# Patient Record
Sex: Female | Born: 1970 | Race: Asian | Hispanic: Yes | Marital: Single | Smoking: Never smoker
Health system: Southern US, Community
[De-identification: ages and names within clinical notes are randomized; demographics above are authoritative.]

## PROBLEM LIST (undated history)

## (undated) DIAGNOSIS — G5600 Carpal tunnel syndrome, unspecified upper limb: Secondary | ICD-10-CM

## (undated) HISTORY — PX: CARPAL TUNNEL RELEASE: SHX101

## (undated) HISTORY — PX: ACHILLES TENDON REPAIR: SUR1153

## (undated) HISTORY — PX: EYE SURGERY: SHX253

## (undated) HISTORY — PX: FEMUR SURGERY: SHX943

## (undated) HISTORY — PX: HERNIA REPAIR: SHX51

---

## 2015-01-20 ENCOUNTER — Emergency Department (INDEPENDENT_AMBULATORY_CARE_PROVIDER_SITE_OTHER)
Admission: EM | Admit: 2015-01-20 | Discharge: 2015-01-20 | Disposition: A | Discharge status: Home / Self Care | Payer: Self-pay | Source: Home / Self Care | Attending: Family Medicine | Admitting: Family Medicine

## 2015-01-20 ENCOUNTER — Encounter (HOSPITAL_COMMUNITY): Payer: Self-pay | Admitting: Emergency Medicine

## 2015-01-20 DIAGNOSIS — K589 Irritable bowel syndrome without diarrhea: Secondary | ICD-10-CM

## 2015-01-20 DIAGNOSIS — E739 Lactose intolerance, unspecified: Secondary | ICD-10-CM

## 2015-01-20 MED ORDER — ONDANSETRON HCL 4 MG/2ML IJ SOLN
4.0000 mg | Freq: Once | INTRAMUSCULAR | Status: AC
  Administered 2015-01-20: 4 mg via INTRAMUSCULAR

## 2015-01-20 MED ORDER — HYOSCYAMINE SULFATE ER 0.375 MG PO TB12
0.3750 mg | ORAL_TABLET | Freq: Two times a day (BID) | ORAL | Status: DC
Start: 2015-01-20 — End: 2015-03-18

## 2015-01-20 MED ORDER — ONDANSETRON HCL 4 MG/2ML IJ SOLN
INTRAMUSCULAR | Status: AC
  Filled 2015-01-20: qty 2

## 2015-01-20 NOTE — ED Provider Notes (Signed)
CSN: [id number]     Arrival date & time 01/20/15  1659 History   First MD Initiated Contact with Patient 01/20/15 1815     Chief Complaint  Patient presents with  . Abdominal Pain   (Consider location/radiation/quality/duration/timing/severity/associated sxs/prior Treatment) Patient is a 44 y.o. female presenting with abdominal pain. The history is provided by the patient and the spouse. The history is limited by a language barrier. A language interpreter was used Forensic scientist(ramon and husband trans.).  Abdominal Pain Pain location:  RLQ Pain quality: cramping and stabbing   Pain radiates to:  Does not radiate Chronicity:  New Context: diet changes and suspicious food intake   Context comment:  Aggrav sx after dairy products. Relieved by:  None tried Worsened by:  Nothing tried Ineffective treatments:  None tried Associated symptoms: nausea   Associated symptoms: no fever, no hematemesis, no hematochezia, no hematuria and no vomiting   Associated symptoms comment:  Pain level 4/10 Risk factors comment:  Neg extensive w/u in [place] in May prior to coming to [place], except colonoscopy not done   History reviewed. No pertinent past medical history. Past Surgical History  Procedure Laterality Date  . Hernia repair     No family history on file. History  Substance Use Topics  . Smoking status: Never Smoker   . Smokeless tobacco: Not on file  . Alcohol Use: No   OB History    No data available     Review of Systems  Constitutional: Negative.  Negative for fever.  Gastrointestinal: Positive for nausea and abdominal pain. Negative for vomiting, blood in stool, hematochezia, abdominal distention, anal bleeding, rectal pain and hematemesis.  Genitourinary: Negative for hematuria.    Allergies  Review of patient's allergies indicates no known allergies.  Home Medications   Prior to Admission medications   Medication Sig Start Date End Date Taking? Authorizing Provider  diazepam (VALIUM) 5  MG tablet Take 5 mg by mouth every 6 (six) hours as needed for anxiety.   Yes Historical Provider, MD  hyoscyamine (LEVBID) 0.375 MG 12 hr tablet Take 1 tablet (0.375 mg total) by mouth 2 (two) times daily. 01/20/15   Linna HoffJames D Einar Nolasco, MD   BP 143/98 mmHg  Pulse 75  Temp(Src) 98.4 F (36.9 C) (Oral)  Resp 16  SpO2 99%  LMP 12/28/2014 Physical Exam  Constitutional: She is oriented to person, place, and time. She appears well-developed and well-nourished. No distress.  Abdominal: Soft. Normal appearance and bowel sounds are normal. She exhibits no mass. There is tenderness. There is no rigidity, no rebound and no guarding.    Neurological: She is alert and oriented to person, place, and time.  Skin: Skin is warm and dry.  Nursing note and vitals reviewed.   ED Course  Procedures (including critical care time) Labs Review Labs Reviewed - No data to display  Imaging Review No results found.   MDM   1. IBS (irritable bowel syndrome)   2. Lactose intolerance in adult        Linna HoffJames D Miyuki Rzasa, MD 01/20/15 [phone]

## 2015-01-20 NOTE — ED Notes (Signed)
2 days of abdominal pain.  Has had a similar episode before and tests run, but everything was fine-[place].  This episode, the pain is worse.  Last bm yesterday and normal.  No issues with urination.  Patient reports flank pain.  Describes pain as sharp, stabbing.

## 2015-01-20 NOTE — Discharge Instructions (Signed)
Diet and medicine as advised, see specialist if further problems.

## 2015-03-18 ENCOUNTER — Encounter (HOSPITAL_COMMUNITY): Payer: Self-pay | Admitting: Emergency Medicine

## 2015-03-18 ENCOUNTER — Emergency Department (HOSPITAL_COMMUNITY): Payer: Medicaid Other

## 2015-03-18 ENCOUNTER — Emergency Department (HOSPITAL_COMMUNITY)
Admission: EM | Admit: 2015-03-18 | Discharge: 2015-03-18 | Disposition: A | Discharge status: Home / Self Care | Payer: Medicaid Other | Attending: Emergency Medicine | Admitting: Emergency Medicine

## 2015-03-18 DIAGNOSIS — R197 Diarrhea, unspecified: Secondary | ICD-10-CM

## 2015-03-18 DIAGNOSIS — K580 Irritable bowel syndrome with diarrhea: Secondary | ICD-10-CM | POA: Insufficient documentation

## 2015-03-18 DIAGNOSIS — E739 Lactose intolerance, unspecified: Secondary | ICD-10-CM

## 2015-03-18 DIAGNOSIS — Z3202 Encounter for pregnancy test, result negative: Secondary | ICD-10-CM | POA: Insufficient documentation

## 2015-03-18 DIAGNOSIS — R1031 Right lower quadrant pain: Secondary | ICD-10-CM | POA: Diagnosis present

## 2015-03-18 DIAGNOSIS — G8929 Other chronic pain: Secondary | ICD-10-CM | POA: Insufficient documentation

## 2015-03-18 DIAGNOSIS — N830 Follicular cyst of ovary: Secondary | ICD-10-CM | POA: Insufficient documentation

## 2015-03-18 DIAGNOSIS — R112 Nausea with vomiting, unspecified: Secondary | ICD-10-CM

## 2015-03-18 DIAGNOSIS — K589 Irritable bowel syndrome without diarrhea: Secondary | ICD-10-CM

## 2015-03-18 DIAGNOSIS — N83202 Unspecified ovarian cyst, left side: Secondary | ICD-10-CM

## 2015-03-18 DIAGNOSIS — R109 Unspecified abdominal pain: Secondary | ICD-10-CM

## 2015-03-18 LAB — COMPREHENSIVE METABOLIC PANEL
ALBUMIN: 3.5 g/dL (ref 3.5–5.0)
ALT: 13 U/L — ABNORMAL LOW (ref 14–54)
AST: 16 U/L (ref 15–41)
Alkaline Phosphatase: 48 U/L (ref 38–126)
Anion gap: 8 (ref 5–15)
BILIRUBIN TOTAL: 0.5 mg/dL (ref 0.3–1.2)
BUN: 6 mg/dL (ref 6–20)
CALCIUM: 8.4 mg/dL — AB (ref 8.9–10.3)
CO2: 21 mmol/L — ABNORMAL LOW (ref 22–32)
CREATININE: 0.74 mg/dL (ref 0.44–1.00)
Chloride: 106 mmol/L (ref 101–111)
GFR calc Af Amer: >60 mL/min (ref >60)
GLUCOSE: 88 mg/dL (ref 65–99)
Potassium: 3.9 mmol/L (ref 3.5–5.1)
Sodium: 135 mmol/L (ref 135–145)
TOTAL PROTEIN: 6.3 g/dL — AB (ref 6.5–8.1)

## 2015-03-18 LAB — URINALYSIS, ROUTINE W REFLEX MICROSCOPIC
BILIRUBIN URINE: NEGATIVE
GLUCOSE, UA: NEGATIVE mg/dL
HGB URINE DIPSTICK: NEGATIVE
Ketones, ur: 15 mg/dL — AB
Leukocytes, UA: NEGATIVE
Nitrite: NEGATIVE
Protein, ur: NEGATIVE mg/dL
SPECIFIC GRAVITY, URINE: 1.005 (ref 1.005–1.030)
Urobilinogen, UA: 0.2 mg/dL (ref 0.0–1.0)
pH: 6 (ref 5.0–8.0)

## 2015-03-18 LAB — CBC WITH DIFFERENTIAL/PLATELET
BASOS ABS: 0 10*3/uL (ref 0.0–0.1)
BASOS PCT: 0 % (ref 0–1)
Eosinophils Absolute: 0.1 10*3/uL (ref 0.0–0.7)
Eosinophils Relative: 1 % (ref 0–5)
HEMATOCRIT: 38 % (ref 36.0–46.0)
HEMOGLOBIN: 12.6 g/dL (ref 12.0–15.0)
LYMPHS PCT: 23 % (ref 12–46)
Lymphs Abs: 1.8 10*3/uL (ref 0.7–4.0)
MCH: 27.2 pg (ref 26.0–34.0)
MCHC: 33.2 g/dL (ref 30.0–36.0)
MCV: 81.9 fL (ref 78.0–100.0)
Monocytes Absolute: 0.4 10*3/uL (ref 0.1–1.0)
Monocytes Relative: 5 % (ref 3–12)
NEUTROS PCT: 71 % (ref 43–77)
Neutro Abs: 5.6 10*3/uL (ref 1.7–7.7)
Platelets: 194 10*3/uL (ref 150–400)
RBC: 4.64 MIL/uL (ref 3.87–5.11)
RDW: 13.6 % (ref 11.5–15.5)
WBC: 7.9 10*3/uL (ref 4.0–10.5)

## 2015-03-18 LAB — LIPASE, BLOOD: Lipase: 33 U/L (ref 22–51)

## 2015-03-18 LAB — POC URINE PREG, ED: Preg Test, Ur: NEGATIVE

## 2015-03-18 MED ORDER — MORPHINE SULFATE (PF) 4 MG/ML IV SOLN
4.0000 mg | Freq: Once | INTRAVENOUS | Status: AC
  Administered 2015-03-18: 4 mg via INTRAVENOUS
  Filled 2015-03-18: qty 1

## 2015-03-18 MED ORDER — HYDROCODONE-ACETAMINOPHEN 5-325 MG PO TABS: 1.0000 | ORAL_TABLET | Freq: Four times a day (QID) | ORAL | Status: DC | PRN

## 2015-03-18 MED ORDER — NAPROXEN 500 MG PO TABS: 500.0000 mg | ORAL_TABLET | Freq: Two times a day (BID) | ORAL | Status: DC | PRN

## 2015-03-18 MED ORDER — IOHEXOL 300 MG/ML  SOLN
100.0000 mL | Freq: Once | INTRAMUSCULAR | Status: AC | PRN
  Administered 2015-03-18: 100 mL via INTRAVENOUS

## 2015-03-18 MED ORDER — IOHEXOL 300 MG/ML  SOLN
25.0000 mL | Freq: Once | INTRAMUSCULAR | Status: DC | PRN
  Administered 2015-03-18: 25 mL via ORAL
  Filled 2015-03-18: qty 30

## 2015-03-18 MED ORDER — DICYCLOMINE HCL 20 MG PO TABS: 20.0000 mg | ORAL_TABLET | Freq: Two times a day (BID) | ORAL | Status: DC | PRN

## 2015-03-18 MED ORDER — ONDANSETRON HCL 4 MG/2ML IJ SOLN
4.0000 mg | Freq: Once | INTRAMUSCULAR | Status: AC
  Administered 2015-03-18: 4 mg via INTRAVENOUS
  Filled 2015-03-18: qty 2

## 2015-03-18 MED ORDER — ONDANSETRON HCL 8 MG PO TABS: 8.0000 mg | ORAL_TABLET | Freq: Three times a day (TID) | ORAL | Status: DC | PRN

## 2015-03-18 MED ORDER — SODIUM CHLORIDE 0.9 % IV BOLUS (SEPSIS)
1000.0000 mL | Freq: Once | INTRAVENOUS | Status: AC
Start: 2015-03-18 — End: 2015-03-18
  Administered 2015-03-18: 1000 mL via INTRAVENOUS

## 2015-03-18 NOTE — ED Notes (Signed)
Pt here c/o RLQ pain with radiation to right flank x 10 months worse after eating foods with lactose

## 2015-03-18 NOTE — Discharge Instructions (Signed)
Tus simptomas pueden ser por una problema con lacteos, o puede ser intestinos irritables. Tambien puede ser por un cyste del ovario. [place] naprosyn y norco para dolor, pero no podes manejar cuando estas tomando norco. [place] bentyl para tus simptomas. [place] zofran para nausea. Llama al gastroenterologo para una cita en 1-2 semanas. Hace una cita con [facility] en 1 semana para tener un doctor primaria. Si tus simptomas [place] or empeoran, volve a la sala de Sports administrator.   Use zofran as prescribed, as needed for nausea. Stay well hydrated with small sips of fluids throughout the day. Follow a BRAT (banana-rice-applesauce-toast) diet as described below for the next 24-48 hours. The 'BRAT' diet is suggested, then progress to diet as tolerated as symptoms abate. Call your doctor if bloody stools, persistent diarrhea, vomiting, fever or abdominal pain. Return to ER for changing or worsening of symptoms.  Food Choices to Help Relieve Diarrhea When you have diarrhea, the foods you eat and your eating habits are very important. Choosing the right foods and drinks can help relieve diarrhea. Also, because diarrhea can last up to 7 days, you need to replace lost fluids and electrolytes (such as sodium, potassium, and chloride) in order to help prevent dehydration.  WHAT GENERAL GUIDELINES DO I NEED TO FOLLOW?  Slowly drink 1 cup (8 oz) of fluid for each episode of diarrhea. If you are getting enough fluid, your urine will be clear or pale yellow.  Eat starchy foods. Some good choices include white rice, white toast, pasta, low-fiber cereal, baked potatoes (without the skin), saltine crackers, and bagels.  Avoid large servings of any cooked vegetables.  Limit fruit to two servings per day. A serving is  cup or 1 small piece.  Choose foods with less than 2 g of fiber per serving.  Limit fats to less than 8 tsp (38 g) per day.  Avoid fried foods.  Eat foods that have probiotics in them. Probiotics  can be found in certain dairy products.  Avoid foods and beverages that may increase the speed at which food moves through the stomach and intestines (gastrointestinal tract). Things to avoid include:  High-fiber foods, such as dried fruit, raw fruits and vegetables, nuts, seeds, and whole grain foods.  Spicy foods and high-fat foods.  Foods and beverages sweetened with high-fructose corn syrup, honey, or sugar alcohols such as xylitol, sorbitol, and mannitol. WHAT FOODS ARE RECOMMENDED? Grains White rice. White, [place], or pita breads (fresh or toasted), including plain rolls, buns, or bagels. White pasta. Saltine, soda, or graham crackers. Pretzels. Low-fiber cereal. Cooked cereals made with water (such as cornmeal, farina, or cream cereals). Plain muffins. Matzo. Melba toast. Zwieback.  Vegetables Potatoes (without the skin). Strained tomato and vegetable juices. Most well-cooked and canned vegetables without seeds. Tender lettuce. Fruits Cooked or canned applesauce, apricots, cherries, fruit cocktail, grapefruit, peaches, pears, or plums. Fresh bananas, apples without skin, cherries, grapes, cantaloupe, grapefruit, peaches, oranges, or plums.  Meat and Other Protein Products Baked or boiled chicken. Eggs. Tofu. Fish. Seafood. Smooth peanut butter. Ground or well-cooked tender beef, ham, veal, lamb, pork, or poultry.  Dairy Plain yogurt, kefir, and unsweetened liquid yogurt. Lactose-free milk, buttermilk, or soy milk. Plain hard cheese. Beverages Sport drinks. Clear broths. Diluted fruit juices (except prune). Regular, caffeine-free sodas such as ginger ale. Water. Decaffeinated teas. Oral rehydration solutions. Sugar-free beverages not sweetened with sugar alcohols. Other Bouillon, broth, or soups made from recommended foods.  The items listed above may not be  a complete list of recommended foods or beverages. Contact your dietitian for more options. WHAT FOODS ARE NOT  RECOMMENDED? Grains Whole grain, whole wheat, bran, or rye breads, rolls, pastas, crackers, and cereals. Wild or brown rice. Cereals that contain more than 2 g of fiber per serving. Corn tortillas or taco shells. Cooked or dry oatmeal. Granola. Popcorn. Vegetables Raw vegetables. Cabbage, broccoli, Brussels sprouts, artichokes, baked beans, beet greens, corn, kale, legumes, peas, sweet potatoes, and yams. Potato skins. Cooked spinach and cabbage. Fruits Dried fruit, including raisins and dates. Raw fruits. Stewed or dried prunes. Fresh apples with skin, apricots, mangoes, pears, raspberries, and strawberries.  Meat and Other Protein Products Chunky peanut butter. Nuts and seeds. Beans and lentils. Tomasa Blase.  Dairy High-fat cheeses. Milk, chocolate milk, and beverages made with milk, such as milk shakes. Cream. Ice cream. Sweets and Desserts Sweet rolls, doughnuts, and sweet breads. Pancakes and waffles. Fats and Oils Butter. Cream sauces. Margarine. Salad oils. Plain salad dressings. Olives. Avocados.  Beverages Caffeinated beverages (such as coffee, tea, soda, or energy drinks). Alcoholic beverages. Fruit juices with pulp. Prune juice. Soft drinks sweetened with high-fructose corn syrup or sugar alcohols. Other Coconut. Hot sauce. Chili powder. Mayonnaise. Gravy. Cream-based or milk-based soups.  The items listed above may not be a complete list of foods and beverages to avoid. Contact your dietitian for more information. WHAT SHOULD I DO IF I BECOME DEHYDRATED? Diarrhea can sometimes lead to dehydration. Signs of dehydration include dark urine and dry mouth and skin. If you think you are dehydrated, you should rehydrate with an oral rehydration solution. These solutions can be purchased at pharmacies, retail stores, or online.  Drink -1 cup (120-240 mL) of oral rehydration solution each time you have an episode of diarrhea. If drinking this amount makes your diarrhea worse, try drinking smaller  amounts more often. For example, drink 1-3 tsp (5-15 mL) every 5-10 minutes.  A general rule for staying hydrated is to drink 1-2 L of fluid per day. Talk to your health care provider about the specific amount you should be drinking each day. Drink enough fluids to keep your urine clear or pale yellow. Document Released: 09/17/2003 Document Revised: 07/02/2013 Document Reviewed: 05/20/2013 [facility] Patient Information 2015 [place], [place]. This information is not intended to replace advice given to you by your health care provider. Make sure you discuss any questions you have with your health care provider.  Abdominal (belly) pain can be caused by many things. Your caregiver performed an examination and possibly ordered blood/urine tests and imaging (CT scan, x-rays, ultrasound). Many cases can be observed and treated at home after initial evaluation in the emergency department. Even though you are being discharged home, abdominal pain can be unpredictable. Therefore, you need a repeated exam if your pain does not resolve, returns, or worsens. Most patients with abdominal pain don't have to be admitted to the hospital or have surgery, but serious problems like appendicitis and gallbladder attacks can start out as nonspecific pain. Many abdominal conditions cannot be diagnosed in one visit, so follow-up evaluations are very important. SEEK IMMEDIATE MEDICAL ATTENTION IF YOU DEVELOP ANY OF THE FOLLOWING SYMPTOMS:  The pain does not go away or becomes severe.   A temperature above 101 develops.   Repeated vomiting occurs (multiple episodes).   The pain becomes localized to portions of the abdomen. The right side could possibly be appendicitis. In an adult, the left lower portion of the abdomen could be colitis or diverticulitis.  Blood is being passed in stools or vomit (bright red or black tarry stools).   Return also if you develop chest pain, difficulty breathing, dizziness or fainting, or become  confused, poorly responsive, or inconsolable (young children).  The constipation stays for more than 4 days.   There is belly (abdominal) or rectal pain.   You do not seem to be getting better.     Dolor abdominal en las mujeres (Abdominal Pain, Women) El dolor abdominal (en el estmago, la pelvis o el vientre) puede tener muchas causas. Es importante que le informe a su mdico:  La ubicacin del Engineer, mining.  Viene y se va, o persiste todo el tiempo?  Hay situaciones que Location manager (comer ciertos alimentos, la actividad fsica)?  Tiene otros sntomas asociados al dolor (fiebre, nuseas, vmitos, diarrea)? Todo es de gran ayuda cuando se trata de hallar la causa del dolor. CAUSAS  Estmago: Infecciones por virus o bacterias, o lcera.  Intestino: Apendicitis (apndice inflamado), ileitis regional (enfermedad de Crohn), colitis ulcerosa (colon inflamado), sndrome del colon irritable, diverticulitis (inflamacin de los divertculos del colon) o cncer de estmago oo intestino.  Enfermedades de la vescula biliar o clculos.  Enfermedades renales, clculos o infecciones en el rin.  Infeccin o cncer del pncreas.  Fibromialgia (trastorno doloroso)  Enfermedades de los rganos femeninos:  Uterus: tero: fibroma (tumor no canceroso) o infeccin  Trompas de [place]: infeccin o embarazo ectpico  En los ovarios, quistes o tumores.  Adherencias plvicas (tejido cicatrizal).  Endometriosis (el tejido que cubre el tero se desarrolla en la pelvis y los rganos plvicos).  Sndrome de Agricultural engineer (los rganos femeninos se llenan de sangre antes del periodo menstrual(  Dolor durante el periodo menstrual.  Dolor durante la ovulacin (al producir vulos).  Dolor al usar el DIU (dispositivo intrauterino para el control de la natalidad)  Psychologist, clinical los rganos femeninos.  Dolor funcional (no est originado en una enfermedad, puede mejorar sin  tratamiento).  Dolor de origen psicolgico  Depresin. DIAGNSTICO Su mdico decidir la gravedad del dolor a travs del examen fsico  Anlisis de sangre  Radiografas  Ecografas  TC (tomografa computada, tipo especial de radiografas).  IMR (resonancia magntica)  Cultivos, en el caso una infeccin  Colon por enema de bario (se inserta una sustancia de contraste en el intestino grueso para mejorar la observacin con rayos X.)  Colonoscopa (observacin del intestino con un tubo luminoso).  Laparoscopa (examen del interior del abdomen con un tubo que tiene Intel Corporation).  Ciruga exploratoria abdominal mayor (se observa el abdomen realizando una gran incisin). TRATAMIENTO El tratamiento depender de la causa del problema.   Muchos de estos casos pueden controlarse y tratarse en casa.  Medicamentos de venta libre indicados por el mdico.  Medicamentos con receta.  Antibiticos, en caso de infeccin  Pldoras anticonceptivas, en el caso de perodos dolorosos o dolor al ovular.  Tratamiento hormonal, para la endometriosis  Inyecciones para bloqueo nervioso selectivo.  Fisioterapia.  Antidepresivos.  Consejos por parte de un psclogo o psiquiatra.  Ciruga mayor o menor. INSTRUCCIONES PARA EL CUIDADO DOMICILIARIO  No tome ni administre laxantes a menos que se lo haya indicado su mdico.  Tome analgsicos de venta libre slo si se lo ha indicado el profesional que lo asiste. No tome aspirina, ya que puede causar Apple Computer o hemorragias.  Consuma una dieta lquida (caldo o agua) segn lo indicado por el mdico. Progrese lentamente a una dieta blanda, segn la [place],  si el dolor se relaciona con el estmago o el intestino.  Tenga un termmetro y tmese la temperatura varias veces al da.  Haga reposo en la cama y [place], si esto Research scientist (life sciences).  Evite las relaciones sexuales, Counsellor.  Evite las situaciones  estresantes.  Cumpla con las visitas y los anlisis de control, segn las indicaciones de su mdico.  Si el dolor no se [place] con los medicamentos o la [place], [place] tratar con:  Acupuntura.  Ejercicios de relajacin (yoga, meditacin).  Terapia grupal.  Psicoterapia. SOLICITE ATENCIN MDICA SI:  Nota que ciertos Pharmacist, community de [place].  El tratamiento indicado para Arboriculturist no Marketing executive.  Necesita analgsicos ms fuertes.  Quiere que le retiren el DIU.  Si se siente confundido o desfalleciente.  Presenta nuseas o vmitos.  Aparece una erupcin cutnea.  Sufre efectos adversos o una reaccin alrgica debido a los medicamentos que toma. SOLICITE ATENCIN MDICA DE INMEDIATO SI:  El dolor persiste o se agrava.  Tiene fiebre.  Siente el dolor slo en algunos sectores del abdomen. Si se localiza en la zona derecha, posiblemente podra tratarse de apendicitis. En un adulto, si se localiza en la regin inferior izquierda del abdomen, podra tratarse de colitis o diverticulitis.  Hay sangre en las heces (deposiciones de color rojo brillante o negro alquitranado), con o sin vmitos.  Usted presenta sangre en la orina.  Siente escalofros con o sin fiebre.  Se desmaya. ASEGRESE QUE:   Comprende estas instrucciones.  Controlar su enfermedad.  Solicitar ayuda de inmediato si no mejora o si empeora. Document Released: 10/13/2008 Document Revised: 09/19/2011 [facility] Patient Information 2015 [place], [place]. This information is not intended to replace advice given to you by your health care provider. Make sure you discuss any questions you have with your health care provider.  Diarrea  (Diarrhea) La diarrea consiste en evacuaciones intestinales frecuentes, blandas o acuosas. Puede hacerlo sentir dbil y deshidratado. La deshidratacin puede hacer que se sienta cansado, sediento, tener la boca seca y que haya disminucin de  [place], que a menudo es de color amarillo oscuro. La diarrea es un signo de otro problema, generalmente una infeccin que no durar [employer]. En la [employer], la diarrea dura tpicamente 2 a 3 das. Sin embargo, puede durar ms tiempo si se trata de un signo de algo ms serio. Es importante tratar la diarrea como lo indique su mdico para disminuir o prevenir futuros episodios de Research scientist (medical).  CAUSAS  Algunas causas comunes son:   Infecciones gastrointestinales causadas por virus, bacterias o parsitos.  Intoxicacin alimentaria o alergias a los alimentos.  Ciertos medicamentos, como los antibiticos, quimioterapia y laxantes.  Edulcorantes artificiales y fructosa.  Los trastornos [employer] Nextel Corporation. INSTRUCCIONES PARA EL CUIDADO EN EL HOGAR   Asegure una adecuada ingesta de lquidos (hidratacin). Evite los lquidos que contengan azcares simples o las bebidas deportivas, los jugos de frutas, los productos derivados de la leche entera y Shell. Si bebe la cantidad suficiente de lquidos, la orina debe ser clara o amarillo plido. Una solucin de rehidratacin oral se puede comprar en las farmacias, en las tiendas minoristas y por Internet. Se puede preparar una solucin de rehidratacin oral casera con los siguientes ingredientes:   - cucharadita de sal.   cucharadita de bicarbonato.   de cucharadita de sal sustituta que contenga cloruro de potasio.  1  cucharada de azcar.  1l (34 onzas) de agua.  Ciertos alimentos y bebidas pueden aumentar la velocidad a la que el alimento se mueve a travs del tracto gastrointestinal (GI). Estos alimentos y bebidas deben evitarse e incluyen:  Bebidas alcohlicas y con cafena.  Alimentos ricos en fibra, como frutas y verduras, nueces, semillas, panes y cereales integrales.  Alimentos y bebidas endulzados con alcoholes de azcar, tales como xilitol, sorbitol, y manitol.  Algunos alimentos pueden ser bien tolerados y puede ayudar a  [employer], incluyendo:  Alimentos con almidn, como arroz, pan, pasta, cereales bajos en azcar, avena, smola de maz, papas al horno, galletas y panecillos.  Bananas.  Pur de [employer].  Agregue alimentos ricos en probiticos a la dieta del nio para ayudar a aumentar las bacterias saludables en el tracto gastrointestinal, como el yogur y productos lcteos fermentados.  Lvese bien las manos despus de cada episodio de diarrea.  Tome slo medicamentos de venta libre o recetados, segn las indicaciones del mdico.  Dewey un bao caliente para ayudar a disminuir ardor o dolor por los episodios frecuentes de diarrea. SOLICITE ATENCIN MDICA DE INMEDIATO SI:   No puede retener los lquidos.  Tiene vmitos persistentes.  Maxie Better en la materia fecal, o las heces son negras y de aspecto alquitranado.  No hay emisin de [place] durante 6 a 8 horas o elimina una pequea cantidad de [place].  Tiene dolor abdominal que aumenta o se localiza.  Est muy mareado o se desvanece.  Sufre un dolor intenso de [place].  La diarrea empeora o no mejora.  Tiene fiebre o sntomas que persisten durante ms de 2 o 3 das.  Tiene fiebre y los sntomas empeoran de manera sbita. ASEGRESE DE QUE:   Comprende estas instrucciones.  Controlar su enfermedad.  Solicitar ayuda de inmediato si no mejora o si empeora. Document Released: 06/27/2005 Document Revised: 06/13/2012 [facility] Patient Information 2015 [place], [place]. This information is not intended to replace advice given to you by your health care provider. Make sure you discuss any questions you have with your health care provider.  Opciones de alimentos para ayudar a Actuary (Food Choices to Help Relieve Diarrhea) Cuando se tiene diarrea, los alimentos que se ingieren y los hbitos de alimentacin son Engineer, production. Elegir los Altria Group y las bebidas adecuados ayuda a Actuary. Adems, debido a  que la diarrea puede durar [employer]vin[employer], debe reponer la prdida de lquidos y Customer service manager (como sodio, potasio y Editor, commissioning) a fin de ayudar a Statistician.  QU PAUTAS GENERALES DEBO SEGUIR?  Beba lentamente 1 taza (8 onzas) de lquido por cada episodio de diarrea. Si bebe una cantidad de lquidos suficiente, la orina ser de tono claro o color amarillo plido.  Consuma alimentos con almidn. Algunas buenas opciones son arroz blanco, tostada blanca, pasta, cereales con bajo contenido de fibras, papas al horno (sin cscara), galletas saladas y panecillos.  Evite las porciones grandes de cualquier vegetal cocido.  Limite las frutas a dos porciones por da. Una porcin es  taza o un trozo pequeo.  Alimentos con menos de 2 g de fibra por porcin.  Limite las grasas a menos de 8 cucharaditas (38g) por Futures trader.  Evite las comidas fritas.  Consuma alimentos que contengan probiticos. Los probiticos se encuentran en ciertos productos lcteos.  Evite los alimentos y las bebidas que pueden aumentar la velocidad a la que el alimento se mueve a travs del estmago y de los intestinos (tracto gastrointestinal). Lo que debe evitar:  Alimentos  ricos en fibra, como frutas secas, frutas y vegetales crudos, frutos secos, semillas, alimentos con cereales integrales.  Alimentos muy condimentados y con alto contenido de Neurosurgeon.  Alimentos y bebidas endulzados con jarabe de maz de alto contenido de fructosa, miel o alcoholes de International aid/development worker, como xilitol, sorbitol y manitol. QU ALIMENTOS SE RECOMIENDAN? Cereales Arroz blanco. Pan blanco, francs o pita (fresco o tostado), incluidos los [place], los bollos y las rosquillas. Pastas blancas. Galletas de [place], [place] o [place]. Pretzels. Cereales con bajo contenido de Sara Lee cocidos en agua (como harina de maz, smola o crema de cereales). Muffins. Pan cimo Tostada Melba. [place].  Vegetales Papas (sin cscara). Jugo de tomates o de  vegetales Vegetales bien cocidos o enlatados sin semillas. Deatra James tierna. Frutas Pur de Fisher Scientific cocido o enlatado, damascos, cerezas, cctel de frutas, pomelos, duraznos, peras o ciruelas. Bananas frescas, manzanas sin cscara, cerezas, uvas, meln, pomelo, duraznos, naranjas o ciruelas.  Carnes y otros productos con protenas Pollo al horno o hervido. Huevos. Tofu. Pescado. Mariscos. Mantequilla de man, sin trozos. Carne molida o un bife tierno bien cocido, jamn, ternera, cordero, cerdo o aves.  Lcteos Yogur natural, kefir y Dentist bebible sin Paediatric nurse. Leche sin Advice worker, suero de [place] o [place] de soja. Queso duro comn. Bebidas Bebidas deportivas. Caldos claros. Jugos de fruta diluidos (excepto de ciruelas). Gaseosas sin cafena comunes, como gaseosa de [place]. Agua. Ts descafeinados. Soluciones de rehidratacin oral. Bebidas sin azcar no endulzadas con alcoholes de azcar. Otros Consom, caldo o sopas hechas con los alimentos recomendados.  Los artculos mencionados arriba pueden no ser Raytheon de las bebidas o los alimentos recomendados. Comunquese con el nutricionista para conocer ms opciones. QU ALIMENTOS NO SE RECOMIENDAN? Cereales Cereales, galletas, pastas, panecillos y panes de cereales integrales, salvado o centeno. Arroz integral o arroz salvaje. Cereales con menos de 2 g de fibra por porcin. Tortillas de maz o tacos. Harina de avena cocida o seca. Granola. Palomitas de maz. Vegetales Vegetales crudos. Repollo, brcoli, repollitos de [place], alcachofas, porotos, hojas de remolacha, maz, col rizada, legumbres, guisantes y batatas. Cscara de papas. [place] y repollo cocidos. Nils Pyle Frutas secas, incluidas las ciruelas y los dtiles. Frutas crudas. Compota o ciruelas secas. Manzanas frescas con cscara, damascos, mangos, peras, frambuesas y frutillas.  Carnes y otros productos con protenas Mantequilla de man espesa. Frutos secos y semillas. Porotos y  lentejas. Panceta.  Lcteos Quesos con alto contenido de [place]. Leche, leche chocolatada y bebidas hechas con [place], como los batidos. Crema. Helados. Dulces y [employer], donas y pan dulce. Panqueques y waffles. Grasas y Barnes & Noble. Salsas a base de crema. Margarina. Aceites para ensaladas. Condimentos para ensaladas. Aceitunas. [place].  Bebidas Bebidas con cafena (como caf, t, refrescos o bebidas energizantes). Bebidas alcohlicas. Jugos de frutas con pulpa. Jugo de ciruelas. Bebidas endulzadas con jarabe de maz de alto contenido de fructosa o alcoholes de International aid/development worker. Otros Coco. Salsa picante. [place] en polvo. Mayonesa. Salsas. Sopas a base de crema o de [place].  Los artculos mencionados arriba pueden no ser Raytheon de las bebidas y los alimentos que se Theatre stage manager. Comunquese con el nutricionista para recibir ms informacin. QU DEBO HACER SI ME DESHIDRATO? Algunas veces, la diarrea puede producir deshidratacin. Entre los signos de deshidratacin se incluyen la orina oscura y la boca y la piel secas. Si piensa que est deshidratado, debe rehidratarse con una solucin de rehidratacin oral. Estas soluciones se pueden comprar en las farmacias, en las tiendas minoristas o por  Internet.  Beba  o 1 taza (120-219ml) de solucin de rehidratacin oral cada vez que tenga un episodio de diarrea. Si beber esta cantidad empeora la diarrea, intente beber en cantidades ms pequeas con ms frecuencia. Por ejemplo, tomar 1-3 cucharaditas (5-50ml) cada 5-57minutos.  Una regla general para mantenerse hidratado es beber 1  -2 litros de lquido Air cabin crew. Hable con el mdico sobre la cantidad especfica que usted debe beber diariamente. Beba suficiente lquido para Photographer orina clara o de color amarillo plido. Document Released: 06/27/2005 Document Revised: 07/02/2013 [facility] Patient Information 2015 [place], [place]. This information is not intended to replace  advice given to you by your health care provider. Make sure you discuss any questions you have with your health care provider.  Nuseas y Vmitos (Nausea and Vomiting) La nusea es la sensacin de Dentist en el estmago o de la necesidad de vomitar. El vmito es un reflejo por el que los contenidos del estmago salen por la boca. El vmito puede ocasionar prdida de lquidos del organismo (deshidratacin). Los nios y los [employer]OK pueden deshidratarse rpidamente (en especial si tambin tienen diarrea). Las nuseas y los vmitos son sntoma de un trastorno o enfermedad. Es importante Emergency planning/management officer causa de los sntomas. CAUSAS  Irritacin directa de la membrana que cubre el [place]. Esta irritacin puede ser resultado del aumento de la produccin de cido, (reflujo gastroesofgico), infecciones, intoxicacin alimentaria, ciertos medicamentos (como antinflamatorios no esteroideos), consumo de alcohol o de tabaco.  Seales del cerebro.Estas seales pueden ser un dolor de cabeza, exposicin al calor, trastornos del odo interno, aumento de la presin en el cerebro por lesiones, infeccin, un tumor o conmocin cerebral, estmulos emocionales o problemas metablicos.  Una obstruccin en el tracto gastrointestinal (obstruccin intestinal).  Ciertas enfermedades como la diabetes, problemas en la vescula biliar, apendicitis, problemas renales, cncer, sepsis, sntomas atpicos de infarto o trastornos alimentarios.  Tratamientos mdicos como la quimioterapia y la radiacin.  Medicamentos que inducen al sueo (anestesia general) durante [place]. DIAGNSTICO  El mdico podr solicitarle algunos anlisis si los problemas no mejoran luego de [address]. Tambin podrn pedirle anlisis si los sntomas son graves o si el motivo de los vmitos o las nuseas no est claro. Los [employer] ser:   Anlisis de [place].  Anlisis de [place].  Pruebas de materia fecal.  Cultivos (para buscar  evidencias de infeccin).  Radiografas u otros estudios por imgenes. Los [employer] Southern de las pruebas lo ayudarn al mdico a tomar decisiones acerca del mejor curso de tratamiento o la necesidad de Conseco.  TRATAMIENTO  Debe estar bien hidratado. Beba con frecuencia pequeas cantidades de lquido.Puede beber agua, bebidas deportivas, caldos claros o comer pequeos trocitos de hielo o gelatina para mantenerse hidratado.Cuando coma, hgalo lentamente para evitar las nuseas.Hay medicamentos para evitar las nuseas que pueden aliviarlo.  INSTRUCCIONES PARA EL CUIDADO DOMICILIARIO  Si su mdico le prescribe medicamentos tmelos como se le haya indicado.  Si no tiene hambre, no se fuerce a comer. Sin embargo, es necesario que tome lquidos.  Si tiene hambre alimntese con una dieta normal, a menos que el mdico le indique otra cosa.  Los mejores alimentos son Neomia Dear combinacin de carbohidratos complejos (arroz, trigo, papas, pan), carnes magras, yogur, frutas y Sports administrator.  Evite los alimentos ricos en grasas porque dificultan la digestin.  Beba gran cantidad de lquido para mantener la orina de tono claro o color amarillo plido.  Si est deshidratado, consulte a su mdico para que le d  instrucciones especficas para volver a hidratarlo. Los signos de deshidratacin son:  Franz Dell sed.  Labios y boca secos.  Mareos.  Larose Kells.  Disminucin de la frecuencia y cantidad de la [place].  Confusin.  Tiene el pulso o la respiracin acelerados. SOLICITE ATENCIN MDICA DE INMEDIATO SI:  Vomita sangre o algo similar a la borra del caf.  La materia fecal (heces) es negra o tiene [place].  Sufre una cefalea grave o rigidez en el cuello.  Se siente confundido.  Siente dolor abdominal intenso.  Tiene dolor en el pecho o dificultad para respirar.  No orina por 8 horas.  Tiene la piel fra y pegajosa.  Sigue vomitando durante ms de 24 a 48 horas.  Tiene  fiebre. ASEGRESE QUE:   Comprende estas instrucciones.  Controlar su enfermedad.  Solicitar ayuda inmediatamente si no mejora o si empeora. Document Released: 07/17/2007 Document Revised: 09/19/2011 [facility] Patient Information 2015 [place], [place]. This information is not intended to replace advice given to you by your health care provider. Make sure you discuss any questions you have with your health care provider.  Intolerancia a la lactosa, Adulto (Lactose Intolerance, Adult) Hay intolerancia a la lactosa cuando el organismo no puede digerir la [place], un azcar que se halla en la [place] y en los productos lcteos. La causa es que el organismo no produce la cantidad suficiente de una enzima llamada lactasa. Cuando no hay suficiente lactasa para digerir la cantidad de lactosa que se ha consumido, Producer, television/film/video. Intolerancia a la lactosa no significa alergia a los productos lcteos.  En la [employer] Resources, la enfermedad se desarrolla con el [place]. Luego de los 2 aos de vida, se empieza a Research officer, trade union. Pero muchas personas no experimentan sntomas [employer] Energy.  CAUSAS Factores que pueden provocar intolerancia a Water quality scientist.   El envejecimiento.  Nacer sin la capacidad de Scientist, forensic.  .Ciertas enfermedades del sistema digestivo.  Lesiones en el intestino delgado. SNTOMAS  Ganas de vomitar (nuseas).  Diarrea.  Clicos.  Hinchazn.  Gases. Los sntomas aparecen [employer] Scientific hora y dos horas despus de comer o beber productos que [place] manager.  TRATAMIENTO Ningn tratamiento puede hacer que el organismo produzca [place]. Sin embargo, los sntomas pueden controlarse siguiendo una dieta. Le indicarn que tome un medicamento cuando consuma alimentos o bebidas que [place]. El medicamento contiene la enzima lactasa, la que ayuda al organismo a Therapist, nutritional.  INSTRUCCIONES PARA EL CUIDADO EN EL HOGAR   Consuma  productos lcteos segn las indicaciones del mdico o del nutricionista.  Apple Computer medicamentos segn le indic su mdico.  Encuentre productos sin lactosa o con contenido reducido en las tiendas de su localidad.  Hable con su mdico o nutricionista si necesita suplementos dietarios. A continuacin se indica la cantidad de calcio que necesita recibir de la dieta:   19 a 50 aos 1000 mg  Ms de 50 aos 1200 mg Calcio y Advice worker en [place] Comunes Productos que no son lcteos / Contenido de Calcio (mg)  Jugo de naranjas fortificado con calcio, 1 taza / 308-344 mg  Sardinas, con espina comestibles, 3 oz / 270 mg  Salmn, enlatado con espinas comestibles, 3 oz / 205 mg  Leche de soja, fortificada, 1 taza / 200 mg  Brcoli (crudo), 1 taza / 90 mg  Naranja, 1 mediana / 50 mg  Alubias pintas,  taza / 40 mg  Atn, enlatado, 3 oz / 10 mg  Lechuga  verde,  taza / 10 mg Productos lcteos / Contenido de Calcio (mg) / Shelton Silvas Contenido (g)  Yogurt, simple, bajo en grasas, 1 taza / 415 mg / 5 g  Leche descremada, 1 taza / 295 mg / 11 g  Queso Swiss, 1 oz / 270 mg / 1 g  Helado  taza / 85 mg / 6 g  Queso requesn,  taza / 75 mg / 2-3 g SOLICITE ATENCIN MDICA SI:  No obtiene alivio de los sntomas.  Document Released: 09/23/2008 Document Revised: 09/19/2011 [facility] Patient Information 2015 [place], [place]. This information is not intended to replace advice given to you by your health care provider. Make sure you discuss any questions you have with your health care provider.  Dieta libre de lactosa (Lactose-Free Diet) La lactosa es un carbohidrato que se encuentra principalmente en la [place] y los productos lcteos, como tambin en alimentos con Waterproof y suero agregados. Para que la lactosa pueda ser [place] por el cuerpo, debe ser digerida por una enzima. La intolerancia a la lactosa ocurre cuando hay una escasez de [place]. Cuando su cuerpo no puede digerir la lactosa,  puede sentir nuseas, hinchazn, calambres, gases y [place]. TIPOS DE DEFICIENCIA DE LACTASA  Deficiencia de lactasa primaria. ste es el tipo ms comn. Se caracteriza por una reduccin lenta de la actividad de la lactasa.  Deficiencia de Altamease Oiler. Esto ocurre luego de una lesin en la mucosa del intestino delgado como consecuencia de una enfermedad. Tambin puede producirse luego de [place] o de un tratamiento con antibiticos o medicamentos para Animator. La tolerancia a la lactosa vara ampliamente, y cada persona debe determinar cunta cantidad de [place] puede consumir para no desarrollar sntomas. Beber pequeas porciones de [employer]ck & [employer] puede ser de [place]. Algunos estudios sugieren que retardar el vaciamiento gstrico puede ayudar a aumentar la tolerancia a productos lcteos. Esto puede realizarse mediante:  El consumo de [place] o productos lcteos acompaado de otros alimentos, Teacher, English as a foreign language de consumirlos solos.  Consumir leche con un mayor contenido graso. Existen muchos productos lcteos que International aid/development worker pueden tolerar mejor que la leche:  El queso (especialmente queso aejo) - el contenido de lactosa es mucho menor que en la [place].  El consumo de productos lcteos cultivados, como yogur, suero de [place], requesn, y [place] de [address] (kfir) normalmente es bien tolerado por individuos con deficiencia de Building control surveyor. Esto ocurre porque las bacterias ayudan a Therapist, nutritional.  La leche con lactosa hidrolizada contiene un 40-90% menos de lactosa que la [place] y tambin puede ser [place]. REQUERIMIENTOS Estas dietas pueden ser deficientes en calcio, riboflavina, y vitamina D, segn los Recommended Dietary Allowances (cantidades recomendadas en la dieta) del Exxon Mobil Corporation ([employer] de [place]). Es posible que se puedan PepsiCo recomendados, esto depende de la [place] individual y el consumo de sustitutos de [place],  [place], u otros productos lcteos. NOTAS ESPECIALES  La lactosa es un carbohidrato. La principal fuente de alimento son los productos lcteos. Es Secondary school teacher los rtulos de los alimentos. Muchos productos contienen lactosa aunque no hayan sido hechos a partir de [employer]. Busque las siguientes palabras: Suero, slidos lcteos, slidos lcteos deshidratados, polvo de [place] sin grasa. Entre las fuentes comunes de lactosa adems de los productos lcteos se incluyen panes, caramelos, embutidos, alimentos preparados y procesados, y salsas y Financial controller.  Todos los alimentos deben prepararse sin [place], crema u otros productos lcteos.  Puede ser necesario un suplemento de  vitaminas o minerales. Consulte con su mdico o nutricionista registrado.  La lactosa tambin se encuentra en muchos medicamentos de prescripcin o de venta libre.  Puede consumir leche de soja y suplementos libres de lactosa como alternativa a la [place]. ELECCIN DE LOS ALIMENTOS Panes/Fculas  Permitidos:  Panes y bollos hechos sin leche. Pan francs, de [place], [place]. Galletas de soda, galletas graham. Cualquier galleta preparada sin lactosa. Cereales cocidos o deshidratados preparados sin lactosa (vea el rtulo). Patatas, pastas, o arroz, preparados sin leche o [place]. Palomitas de maz.  Evite:  Panes y bollos hechos que ConocoPhillips. Mezclas preparadas como pasteles, bizcochos, buuelos, panqueques. [place] dulces, donas, tostada francesa (si contiene [place]). Bizcochos tostados, o cualquiera galletas que Centex Corporation. Cereales cocidos o deshidratados preparados con lactosa (vea el rtulo). Pur de papas instantneo, papas fritas congeladas, papas festoneadas o gratinadas. Vegatales  Permitidos:  Programmer, systems, congelados o enlatados.  Evite:  Vegetales con crema o rebozados. Vegetales en salsa de queso o con margarinas que contengan lactosa. Frutas  Permitidos:  Nils Pyle frescas,  enlatadas o congeladas que no estn procesadas con lactosa.  Evite:  Frutas enlatadas o congeladas que hayan sido procesadas con lactosa. Carnes y sustitutos  Permitidos:  Bife, pollo, pescado, pavo, cordero, ternero, cerdo o jamn. Productos preparados con carne. Alimentos crnicos preparados para bebs que no contengan leche. Huevos, soja, frutos secos.  Evite:  Huevos revueltos, omelettes y souffles que ConocoPhillips. Scrambled eggs, omelets, and souffles that contain milk. Carne, pescado o aves de corral con crema o empanadas. Salchichas de viena, leverwurst o fiambres que contengan slidos de [place]. Queso, queso cottage o queso untable. Leche  Permitidos:  Careers information officer.  Evite:  Leche (entera, al 2%, descremada o chocolatada). Evaporada, en polvo o condensada. Leche malteada. Sopas y alimentos combinados  Permitidos:  Sopa, caldo, sopa de verduras, consoms. Sopas preparadas en casa con los alimentos permitidos. Alimentos combinados o preparados que no contengan leche ni productos lcteos (lea las etiquetas).  Evite:  Sopas crema, en latas. Sopas comerciales que contengan lactosa Macaroni con queso, pizza. Alimentos combinados o preparados que contengan leche o productos lcteos. Postres y dulces  Permitidos:  Helados de agua y de fruta, gelatina, torta ngel. Galletitas, tortas, pasteles caseros preparados con los ingredientes permitidos. Budn (preparado con agua o sustituto de [place]). Postres de tofu sin lactosa. Azcar, miel, jarabe de maz, mermelada, gelatina, dulces, melaza (azcar de caa). Caramelos de azcar, marshmallows.  Evite:  Helados de crema, sorbetes, flan, budn, yougur helado. Mezclas comerciales para preparar tortas y galletitas. Postres que contengan chocolate. Masa para pastel que Clinical research associate, postres reducidos en caloras preparados con sustitutos del azcar que contangan lactosa. Caramelos toffee, de menta, caramelos duros, chocolate. Grases y  aceites  Permitidos:  Occupational hygienist, (segn la tolerancia; contiene muy pequea cantidad de [place]). Margarinas y [employer] Inc no contengan [place], aceites [place], [place], [place], crema artificial y coberturas sin lactosa ni slidos de Physiological scientist. [place].  Evite:  Margarinas y aderezos para [address] que contengan [place]; [place], [place] de man con slidos de [place] agregados, crema agria, bocaditos preparados con crema agria. Bebidas  Permitidos:  Bebidas carbonatadas, t, caf, y caf soluble Carbonated drinks, tea, caf y caf soluble, algunos cafs instantneos (verifique las etiquetas). Bebidas frutales, jugos de frutas y de [address], [place] de arroz o de soja.  Evite: Chocolate caliente. Algunos cacaos, algunos cafs instantneos, ts instantneos, jugos en polvo (lea las etiquetas). Condimentos  Permitidos:  Salsa de soja, polvo de  algarroba, aceitunas, salsa preparada con agua, cacao, condimentos y [address] [place], glutamato monosdico, [place], [place].  Evite:  Algunas gomas de mascar, chocolate, algunos cacaos. Ciertos antibiticos y preparados de vitaminas y minerales. Condimentos que contengan productos lcteos. Endulzantes artificieles que contengan lactosa. Algunas cremas no lcteas (lea las etiquetas). EJEMPLO DE MEN Desayuno   Jugo de naranjas  [place]  Bran flakes  Desnatador no lcteo  Pan de [place] (brind)  [place] o Psychiatrist y Mining engineer que no Therapist, sports  T o caf [place]  Doctor, hospital de pollo  Arroz  Habichuelas  [place] o Psychiatrist y aderezos que no contengan leche  Meln fresco  T o caf [place]  Ase carne de vaca  Papa asada  [place] o margarinas y aderezos que no contengan leche  Brcol  Ensalada de Company secretary con el vinagre y el petrleo que visten  Bizcocho de alimento de [place]  T [place] Document Released: 06/27/2005 Document Revised: 09/19/2011 [facility] Patient Information 2015 [place], [place]. This information is not  intended to replace advice given to you by your health care provider. Make sure you discuss any questions you have with your health care provider.  Quiste ovrico (Ovarian Cyst) Un quiste ovrico es un saco lleno de lquido o [place]. Este saco est unido al ovario. Algunos quistes desaparecen solos. Otros quistes necesitan tratamiento.  CUIDADOS EN EL HOGAR   Solo tome los medicamentos que le haya indicado su mdico.  Concurra a las consultas de control con el mdico, segn las indicaciones.  Hgase exmenes plvicos regulares y pruebas de Papanicolaou. SOLICITE AYUDA SI:  Sus perodos se atrasan o son irregulares o dolorosos.  Sus perodos cesan.  Siente dolor en el vientre (abdominal) o en la pelvis que no desaparece.  El abdomen se agranda o se inflama hincha.  Tiene dificultades para orinar (vaciar por completo la vejiga).  Siente presin en la vejiga.  Siente dolor durante las [employer].  Siente distensin, presin o Environmental manager.  Pierde peso sin ningn motivo.  Se siente mal constantemente.  Tiene dificultades para defecar (estreimiento).  No tiene ganas de comer.  Le aparecen granos (acn).  Nota un aumento del vello corporal y facial.  Domenic Moras de peso sin motivo.  Sospecha que est embarazada. SOLICITE AYUDA DE INMEDIATO SI:   El dolor en el vientre empeora.  Tiene Programme researcher, broadcasting/film/video (nuseas) y vomita.  Le sube la fiebre rpidamente.  Le duele el estmago mientras defeca.  Los perodos menstruales son ms abundantes de lo habitual. ASEGRESE DE QUE:   Comprende estas instrucciones.  Controlar su afeccin.  Recibir ayuda de inmediato si no mejora o si empeora. Document Released: 04/17/2013 [facility] Patient Information 2015 [place], [place]. This information is not intended to replace advice given to you by your health care provider. Make sure you discuss any questions you have with your health care provider.

## 2015-03-18 NOTE — ED Notes (Signed)
Pt transporting to CT at this time. NAD.

## 2015-03-18 NOTE — ED Provider Notes (Signed)
CSN: [id number]     Arrival date & time 03/18/15  1012 History   First MD Initiated Contact with Patient 03/18/15 1014     Chief Complaint  Patient presents with  . Abdominal Pain     (Consider location/radiation/quality/duration/timing/severity/associated sxs/prior Treatment) HPI Comments: Toni Hall is a 44 y.o. female with a PMHx of IBS and lactose intolerance, who presents to the ED with complaints of recurrent right lower quadrant pain. She reports that she has had 10 months of similar pain, has been worked up in [place] with no final diagnosis given. She reports that last night she developed this pain again, describing it as 7/10 constant waxing and waning sharp right lower quadrant pain radiating to the right flank, worse when she eats lactose foods, and unrelieved with Levbid, ibuprofen, and Tylenol. Associated symptoms include chills, nausea, 4 episodes of nonbloody nonbilious emesis, and 2 episodes of loose nonbloody diarrhea. Denies fevers, CP, SOB, constipation, obstipation, melena, hematochezia, hematemesis, rectal pain, hematuria, dysuria, vaginal bleeding or discharge, numbness, tingling, or weakness. She admits to chronic NSAID use, but denies any recent travel, suspicious food intake, sick contacts, or alcohol use. She denies being sexually active. Her only prior abdominal surgery was an umbilical hernia repair when she was pregnant several years ago.  Patient is a 44 y.o. female presenting with abdominal pain. The history is provided by the patient. A language interpreter was used (provider).  Abdominal Pain Pain location:  RLQ Pain quality: sharp   Pain radiates to:  R flank Pain severity:  Moderate Onset quality:  Gradual Duration:  1 day (10 months, but worse since yesterday) Timing:  Constant Progression:  Waxing and waning Chronicity:  Recurrent Context: not recent travel, not sick contacts and not suspicious food intake   Relieved by:  Nothing Worsened by:  Eating  (lactose foods) Ineffective treatments:  NSAIDs and acetaminophen (levbid, tylenol, motrin) Associated symptoms: diarrhea, nausea and vomiting   Associated symptoms: no chest pain, no chills, no constipation, no dysuria, no fever, no flatus, no hematemesis, no hematochezia, no hematuria, no melena, no shortness of breath, no vaginal bleeding and no vaginal discharge   Risk factors: NSAID use   Risk factors: no alcohol abuse     No past medical history on file. Past Surgical History  Procedure Laterality Date  . Hernia repair     No family history on file. Social History  Substance Use Topics  . Smoking status: Never Smoker   . Smokeless tobacco: Not on file  . Alcohol Use: No   OB History    No data available     Review of Systems  Constitutional: Negative for fever and chills.  Respiratory: Negative for shortness of breath.   Cardiovascular: Negative for chest pain.  Gastrointestinal: Positive for nausea, vomiting, abdominal pain and diarrhea. Negative for constipation, blood in stool, melena, hematochezia, rectal pain, flatus and hematemesis.  Genitourinary: Positive for flank pain. Negative for dysuria, hematuria, vaginal bleeding and vaginal discharge.  Musculoskeletal: Negative for myalgias and arthralgias.  Skin: Negative for color change.  Allergic/Immunologic: Negative for immunocompromised state.  Neurological: Negative for weakness and numbness.  Psychiatric/Behavioral: Negative for confusion.   10 Systems reviewed and are negative for acute change except as noted in the HPI.    Allergies  Review of patient's allergies indicates no known allergies.  Home Medications   Prior to Admission medications   Medication Sig Start Date End Date Taking? Authorizing Provider  diazepam (VALIUM) 5 MG tablet Take 5 mg  by mouth every 6 (six) hours as needed for anxiety.    Historical Provider, MD  hyoscyamine (LEVBID) 0.375 MG 12 hr tablet Take 1 tablet (0.375 mg total) by  mouth 2 (two) times daily. 01/20/15   Linna Hoff, MD   BP 113/67 mmHg  Pulse 68  Temp(Src) 98.3 F (36.8 C) (Oral)  Resp 18  SpO2 100% Physical Exam  Constitutional: She is oriented to person, place, and time. Vital signs are normal. She appears well-developed and well-nourished.  Non-toxic appearance. She appears distressed.  Afebrile, nontoxic, appears uncomfortable, tearful  HENT:  Head: Normocephalic and atraumatic.  Mouth/Throat: Oropharynx is clear and moist and mucous membranes are normal.  Eyes: Conjunctivae and EOM are normal. Right eye exhibits no discharge. Left eye exhibits no discharge.  Neck: Normal range of motion. Neck supple.  Cardiovascular: Normal rate, regular rhythm, normal heart sounds and intact distal pulses.  Exam reveals no gallop and no friction rub.   No murmur heard. Pulmonary/Chest: Effort normal and breath sounds normal. No respiratory distress. She has no decreased breath sounds. She has no wheezes. She has no rhonchi. She has no rales.  Abdominal: Soft. Normal appearance and bowel sounds are normal. She exhibits no distension. There is tenderness in the right lower quadrant. There is no rigidity, no rebound, no guarding, no CVA tenderness, no tenderness at McBurney's point and negative Murphy's sign.    Soft, nondistended, +BS throughout, with exquisite RLQ TTP, no r/g/r, neg murphy's, neg mcburney's, no CVA TTP, neg psoas sign, neg foot tap test  Musculoskeletal: Normal range of motion.  Neurological: She is alert and oriented to person, place, and time. She has normal strength. No sensory deficit.  Skin: Skin is warm, dry and intact. No rash noted.  Psychiatric: She has a normal mood and affect.  Nursing note and vitals reviewed.   ED Course  Procedures (including critical care time) Labs Review Labs Reviewed  COMPREHENSIVE METABOLIC PANEL - Abnormal; Notable for the following:    CO2 21 (*)    Calcium 8.4 (*)    Total Protein 6.3 (*)    ALT  13 (*)    All other components within normal limits  URINALYSIS, ROUTINE W REFLEX MICROSCOPIC (NOT AT [facility]) - Abnormal; Notable for the following:    Ketones, ur 15 (*)    All other components within normal limits  CBC WITH DIFFERENTIAL/PLATELET  LIPASE, BLOOD  POC URINE PREG, ED    Imaging Review Ct Abdomen Pelvis W Contrast  03/18/2015   CLINICAL DATA:  Right lower quadrant pain with radiation to right flank for several months  EXAM: CT ABDOMEN AND PELVIS WITH CONTRAST  TECHNIQUE: Multidetector CT imaging of the abdomen and pelvis was performed using the standard protocol following bolus administration of intravenous contrast. Oral contrast was also administered.  CONTRAST:  OMNIPAQUE IOHEXOL 300 MG/ML  SOLN  COMPARISON:  None.  FINDINGS: Lung bases are clear.  There is decreased attenuation in the liver, a finding consistent with a degree of hepatic steatosis. No focal liver lesions are identified. Gallbladder wall is not appreciably thickened. There is no biliary duct dilatation.  Spleen, pancreas, and adrenals appear normal. Kidneys bilaterally show no mass or hydronephrosis on either side. There is no renal or ureteral calculus on either side.  In the pelvis, the urinary bladder is midline with normal wall thickness. There is evidence of a collapsing left ovarian cyst measuring 2.4 x 2.0 cm. There is no other pelvic mass. A small  amount of free fluid is noted in the cul-de-sac. There is no demonstrable diverticulitis. Sigmoid colon wall is not thickened, and there is no surrounding mesenteric thickening.  Appendix appears within normal limits.  There is no bowel obstruction.  No free air or portal venous air.  There is no adenopathy or abscess in the abdomen or pelvis. The aorta appears unremarkable. There are no blastic or lytic bone lesions.  IMPRESSION: Evidence of hepatic steatosis.  No bowel obstruction. No bowel wall or mesenteric thickening. No abscess. Appendix appears normal.  No  renal or ureteral calculus.  No hydronephrosis.  Small collapsing left ovarian cyst with small amount of free fluid in cul-de-sac. Suspect recent ovarian cyst leakage/rupture.   Electronically Signed   By: Bretta Bang III M.D.   On: 03/18/2015 13:12   I have personally reviewed and evaluated these images and lab results as part of my medical decision-making.   EKG Interpretation None      MDM   Final diagnoses:  Chronic abdominal pain  IBS (irritable bowel syndrome)  Lactose intolerance  Nausea vomiting and diarrhea  RLQ abdominal pain  Cyst of left ovary    44 y.o. female here with chronic abd pain, has been worked up extensively in [place] but no records of this. Pain is RLQ radiating to R flank, states she's never had nephrolithiasis on all her analysis. Reports this morning pain returned again, associated with n/v/d. Worsens with lactose. On exam, exquisite RLQ TTP, nonperitoneal. Likely IBS/lactose intolerance, but will r/o appendicitis or diverticulitis, etc with CT abd/pelvis. Will check labs/Urine. Will give pain meds and nausea meds. Will reassess shortly.   1:52 PM Pain and nausea resolved. U/A clear. Upreg neg. CBC w/diff unremarkable. CMP WNL. Lipase clear. CT showing no acute findings aside from L ovarian cyst, likely from ovulation. Pt now recalling that she's been told this before, and her symptoms seem to always occur around the time she would be ovulating. Tolerating PO here. Will d/c home with pain meds, nausea meds, and bentyl. Discussed f/up with GI in 1wk, and [facility] in 1wk to establish care. I explained the diagnosis and have given explicit precautions to return to the ER including for any other new or worsening symptoms. The patient understands and accepts the medical plan as it's been dictated and I have answered their questions. Discharge instructions concerning home care and prescriptions have been given. The patient is STABLE and is discharged to home in good  condition.  BP 115/67 mmHg  Pulse 58  Temp(Src) 98.3 F (36.8 C) (Oral)  Resp 18  SpO2 100%  LMP   Meds ordered this encounter  Medications  . morphine 4 MG/ML injection 4 mg    Sig:   . ondansetron (ZOFRAN) injection 4 mg    Sig:   . sodium chloride 0.9 % bolus 1,000 mL    Sig:   . iohexol (OMNIPAQUE) 300 MG/ML solution 25 mL    Sig:   . iohexol (OMNIPAQUE) 300 MG/ML solution 100 mL    Sig:   . naproxen (NAPROSYN) 500 MG tablet    Sig: Take 1 tablet (500 mg total) by mouth 2 (two) times daily as needed for mild pain, moderate pain or headache (TAKE WITH MEALS.).    Dispense:  20 tablet    Refill:  0    Order Specific Question:  Supervising Provider    Answer:  MILLER, BRIAN [[id number]]  . HYDROcodone-acetaminophen (NORCO) 5-325 MG per tablet    Sig: Take  1 tablet by mouth every 6 (six) hours as needed for severe pain.    Dispense:  10 tablet    Refill:  0    Order Specific Question:  Supervising Provider    Answer:  Hyacinth Meeker, BRIAN [[id number]]  . dicyclomine (BENTYL) 20 MG tablet    Sig: Take 1 tablet (20 mg total) by mouth 2 (two) times daily as needed for spasms.    Dispense:  20 tablet    Refill:  0    Order Specific Question:  Supervising Provider    Answer:  MILLER, BRIAN [[id number]]  . ondansetron (ZOFRAN) 8 MG tablet    Sig: Take 1 tablet (8 mg total) by mouth every 8 (eight) hours as needed for nausea or vomiting.    Dispense:  10 tablet    Refill:  0    Order Specific Question:  Supervising Provider    Answer:  Eusebio Friendly     Darcel Frane Camprubi-Soms, PA-C 03/18/15 1356  Azalia Bilis, MD 03/18/15 1744

## 2015-04-23 ENCOUNTER — Ambulatory Visit: Payer: Self-pay | Admitting: Family Medicine

## 2015-06-24 ENCOUNTER — Encounter (HOSPITAL_COMMUNITY): Payer: Self-pay | Admitting: Emergency Medicine

## 2015-06-24 ENCOUNTER — Emergency Department (HOSPITAL_COMMUNITY)
Admission: EM | Admit: 2015-06-24 | Discharge: 2015-06-24 | Disposition: A | Discharge status: Home / Self Care | Payer: Medicaid Other | Attending: Emergency Medicine | Admitting: Emergency Medicine

## 2015-06-24 DIAGNOSIS — G5601 Carpal tunnel syndrome, right upper limb: Secondary | ICD-10-CM | POA: Diagnosis not present

## 2015-06-24 DIAGNOSIS — M79641 Pain in right hand: Secondary | ICD-10-CM | POA: Diagnosis present

## 2015-06-24 MED ORDER — KETOROLAC TROMETHAMINE 60 MG/2ML IM SOLN
60.0000 mg | Freq: Once | INTRAMUSCULAR | Status: AC
  Administered 2015-06-24: 60 mg via INTRAMUSCULAR
  Filled 2015-06-24: qty 2

## 2015-06-24 MED ORDER — IBUPROFEN 800 MG PO TABS: 800.0000 mg | ORAL_TABLET | Freq: Three times a day (TID) | ORAL | Status: DC

## 2015-06-24 NOTE — ED Notes (Signed)
Per translator line: pt in for eval of left finger pain x2 weeks, states pain radiates to palm of the hand from middle and ring finger. Pt denies any trauma, nad noted. Pulses present.

## 2015-06-24 NOTE — Discharge Instructions (Signed)
Síndrome del túnel carpiano °(Carpal Tunnel Syndrome) °El síndrome del túnel carpiano es una afección que causa dolor en la mano y en el brazo. El túnel carpiano es un espacio estrecho ubicado en el lado palmar de la muñeca. Los movimientos de la muñeca o ciertas enfermedades pueden causar hinchazón del túnel. Esta hinchazón comprime el nervio principal de la muñeca (nervio mediano). °CAUSAS  °Esta afección puede ser causada por lo siguiente:  °· Movimientos repetidos de la muñeca. °· Lesiones en la muñeca. °· Artritis. °· Un quiste o un tumor en el túnel carpiano. °· Acumulación de líquido durante el embarazo. °A veces, se desconoce la causa de esta afección.  °FACTORES DE RIESGO °Es más probable que esta afección se manifieste en:  °· Las personas que tienen trabajos en los que deben realizar los mismos movimientos repetidos de las muñecas, como los carniceros y los cajeros. °· Las mujeres. °· Las personas que tienen determinadas enfermedades, por ejemplo: °¨ Diabetes. °¨ Obesidad. °¨ Tiroides hipoactiva (hipotiroidismo). °¨ Insuficiencia renal. °SÍNTOMAS  °Los síntomas de esta afección incluyen lo siguiente:  °· Sensación de hormigueo en los dedos de la mano, especialmente el pulgar, el índice y el dedo medio. °· Hormigueo o adormecimiento en la mano. °· Sensación de dolor en todo el brazo, especialmente cuando la muñeca y el codo están flexionados durante mucho tiempo. °· Dolor en la muñeca que sube por el brazo hasta el hombro. °· Dolor que baja por la mano o los dedos. °· Sensación de debilidad en las manos. Tal vez tenga dificultad para tomar y sostener objetos. °Los síntomas pueden empeorar durante la noche.  °DIAGNÓSTICO  °Esta afección se diagnostica mediante la historia clínica y un examen físico. También pueden hacerle exámenes, que incluyen los siguientes:  °· Electromiografía (EMG). Esta prueba mide las señales eléctricas que los nervios les envían a los músculos. °· Radiografías. °TRATAMIENTO  °El  tratamiento de esta afección incluye lo siguiente: °· Cambios en el estilo de vida. Es importante dejar de hacer o modificar la actividad que causó la afección. °· Fisioterapia o terapia ocupacional. °· Analgésicos y antiinflamatorios. Esto puede incluir medicamentos que se inyectan en la muñeca. °· Una férula para la muñeca. °· Cirugía. °INSTRUCCIONES PARA EL CUIDADO EN EL HOGAR  °Si tiene una férula: °· Úsela como se lo haya indicado el médico. Quítesela solamente como se lo haya indicado el médico. °· Afloje la férula si los dedos se le entumecen, siente hormigueos o se le enfrían y se tornan de color azul. °· Mantenga la férula limpia y seca. °Instrucciones generales °· Tome los medicamentos de venta libre y los recetados solamente como se lo haya indicado el médico. °· Haga reposar la muñeca de toda actividad que le cause dolor. Si la afección tiene relación con el trabajo, hable con su empleador sobre los cambios que pueden hacerse, por ejemplo, usar una almohadilla para apoyar la muñeca mientras tipea. °· Si se lo indican, aplique hielo sobre la zona dolorida: °¨ Ponga el hielo en una bolsa plástica. °¨ Coloque una toalla entre la piel y la bolsa de hielo. °¨ Coloque el hielo durante 20 minutos, 2 a 3 veces por día. °· Concurra a todas las visitas de control como se lo haya indicado el médico. Esto es importante. °· Haga los ejercicios como se lo hayan indicado el médico, el fisioterapeuta o el terapeuta ocupacional. °SOLICITE ATENCIÓN MÉDICA SI:  °· Aparecen nuevos síntomas. °· El dolor no se alivia con los medicamentos. °· Los síntomas empeoran. °  °  Esta información no tiene como fin reemplazar el consejo del médico. Asegúrese de hacerle al médico cualquier pregunta que tenga. °  °Document Released: 06/27/2005 Document Revised: 03/18/2015 °Elsevier Interactive Patient Education ©2016 [employer] ° °

## 2015-06-24 NOTE — ED Provider Notes (Signed)
CSN: [id number]     Arrival date & time 06/24/15  2051 History  By signing my name below, I, Toni Hall, attest that this documentation has been prepared under the direction and in the presence of Lavonte Palos, PA-C. Electronically Signed: Evon Hall, ED Scribe. 06/25/2015. 1:08 AM.      Chief Complaint  Patient presents with  . Hand Pain    Patient is a 44 y.o. female presenting with hand pain. The history is provided by the patient. A language interpreter was used.  Hand Pain   HPI Comments: Toni Hall is a 44 y.o. female who presents to the Emergency Department complaining of stabbing-cramping right hand pain onset 3 weeks prior. Pt states that the pain is mostly located in her right 3rd and 4th digit and intermittently radiates to her palm. She describes the pain as "lightening strikes". 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 She states that the pain is worse in the middle of night and occasionally wakes her from sleep. Pt states that she has tried naproxen and ibuprofen with slight relief. Denies exacerbating factors. Pt denies injury or trauma to the hand. Denies loss of sensation or weakness of the hand. She has no other complaints today.    History reviewed. No pertinent past medical history. Past Surgical History  Procedure Laterality Date  . Hernia repair     No family history on file. Social History  Substance Use Topics  . Smoking status: Never Smoker   . Smokeless tobacco: None  . Alcohol Use: No   OB History    No data available     Review of Systems  Musculoskeletal: Positive for arthralgias.  All other systems reviewed and are negative.     Allergies  Review of patient's allergies indicates no known allergies.  Home Medications   Prior to Admission medications   Medication Sig Start Date End Date Taking? Authorizing Provider  dicyclomine (BENTYL) 20 MG tablet Take 1 tablet  (20 mg total) by mouth 2 (two) times daily as needed for spasms. 03/18/15   Mercedes Camprubi-Soms, PA-C  HYDROcodone-acetaminophen (NORCO) 5-325 MG per tablet Take 1 tablet by mouth every 6 (six) hours as needed for severe pain. 03/18/15   Mercedes Camprubi-Soms, PA-C  ibuprofen (ADVIL,MOTRIN) 800 MG tablet Take 1 tablet (800 mg total) by mouth 3 (three) times daily. 06/24/15   Kelseigh Diver, PA-C  naproxen (NAPROSYN) 500 MG tablet Take 1 tablet (500 mg total) by mouth 2 (two) times daily as needed for mild pain, moderate pain or headache (TAKE WITH MEALS.). 03/18/15   Mercedes Camprubi-Soms, PA-C  ondansetron (ZOFRAN) 8 MG tablet Take 1 tablet (8 mg total) by mouth every 8 (eight) hours as needed for nausea or vomiting. 03/18/15   Mercedes Camprubi-Soms, PA-C   BP 130/80 mmHg  Pulse 60  Temp(Src) 97.3 F (36.3 C) (Oral)  Resp 16  Wt 57.352 kg  SpO2 98%   Physical Exam  Constitutional: She is oriented to person, place, and time. She appears well-developed and well-nourished. No distress.  HENT:  Head: Normocephalic and atraumatic.  Eyes: Conjunctivae and EOM are normal. Right eye exhibits no discharge. Left eye exhibits no discharge. No scleral icterus.  Neck: Normal range of motion. Neck supple. No tracheal deviation present.  Cardiovascular: Normal rate, regular rhythm and intact distal pulses.   Radial pulses palpable. Cap refill less than 2 seconds.  Pulmonary/Chest: Effort normal. No respiratory distress.  Musculoskeletal:  Normal range of motion.       Left hand: She exhibits normal range of motion, no tenderness, normal capillary refill, no deformity and no swelling. Normal sensation noted. Normal strength noted.  Right hand and digits are non-tender to palpation. Retains full range of motion of the digits and wrist. No obvious edema or deformity of the hand. Tinel's positive for reproducible symptoms in the second and third digits of the hand.  Neurological: She is alert and oriented to  person, place, and time. Coordination normal.  5/5 grip strength bilaterally. Sensation to light touch intact throughout.  Skin: Skin is warm and dry.  No erythema or other skin changes noted to the left hand.  Psychiatric: She has a normal mood and affect. Her behavior is normal.  Nursing note and vitals reviewed.   ED Course  Procedures (including critical care time) DIAGNOSTIC STUDIES: Oxygen Saturation is 100% on RA, normal by my interpretation.    COORDINATION OF CARE: 10:06 PM-Discussed treatment plan with pt at bedside and pt agreed to plan.     Labs Review Labs Reviewed - No data to display  Imaging Review No results found.    EKG Interpretation None      MDM   Final diagnoses:  Carpal tunnel syndrome of right wrist   44 year old female presenting with 2 week history of intermittent shooting pains in the palm and third and fourth digits of the right hand. Vital signs stable. Right hand is neurovascularly intact. No skin changes noted over the hand. Tinel's test positive for reproducible shooting pains in the second and third digits of the left hand. Pain managed in emergency department with Toradol. Symptoms seem consistent with carpal tunnel syndrome though the thumb is not involved. We'll give patient a wrist brace and NSAIDs. Given referral for orthopedics if symptoms do not improve. Return precautions given in discharge paperwork and discussed with pt at bedside. Pt stable for discharge  I personally performed the services described in this documentation, which was scribed in my presence. The recorded information has been reviewed and is accurate.      [name], PA-C 06/25/15 0109  [name], MD 06/25/15 [phone]

## 2015-06-24 NOTE — ED Notes (Signed)
See pa note.  

## 2015-07-20 ENCOUNTER — Encounter (HOSPITAL_COMMUNITY): Payer: Self-pay | Admitting: Emergency Medicine

## 2015-07-20 ENCOUNTER — Emergency Department (HOSPITAL_COMMUNITY)
Admission: EM | Admit: 2015-07-20 | Discharge: 2015-07-20 | Disposition: A | Discharge status: Home / Self Care | Payer: Medicaid Other | Attending: Emergency Medicine | Admitting: Emergency Medicine

## 2015-07-20 DIAGNOSIS — S39012A Strain of muscle, fascia and tendon of lower back, initial encounter: Secondary | ICD-10-CM | POA: Diagnosis not present

## 2015-07-20 DIAGNOSIS — M25511 Pain in right shoulder: Secondary | ICD-10-CM

## 2015-07-20 DIAGNOSIS — S4991XA Unspecified injury of right shoulder and upper arm, initial encounter: Secondary | ICD-10-CM | POA: Diagnosis not present

## 2015-07-20 DIAGNOSIS — Y9389 Activity, other specified: Secondary | ICD-10-CM | POA: Diagnosis not present

## 2015-07-20 DIAGNOSIS — Z791 Long term (current) use of non-steroidal anti-inflammatories (NSAID): Secondary | ICD-10-CM | POA: Insufficient documentation

## 2015-07-20 DIAGNOSIS — S199XXA Unspecified injury of neck, initial encounter: Secondary | ICD-10-CM | POA: Diagnosis not present

## 2015-07-20 DIAGNOSIS — Y998 Other external cause status: Secondary | ICD-10-CM | POA: Insufficient documentation

## 2015-07-20 DIAGNOSIS — S3992XA Unspecified injury of lower back, initial encounter: Secondary | ICD-10-CM | POA: Diagnosis present

## 2015-07-20 DIAGNOSIS — Y92410 Unspecified street and highway as the place of occurrence of the external cause: Secondary | ICD-10-CM | POA: Insufficient documentation

## 2015-07-20 MED ORDER — IBUPROFEN 800 MG PO TABS: 800.0000 mg | ORAL_TABLET | Freq: Three times a day (TID) | ORAL | Status: DC

## 2015-07-20 MED ORDER — CYCLOBENZAPRINE HCL 10 MG PO TABS: 10.0000 mg | ORAL_TABLET | Freq: Two times a day (BID) | ORAL | Status: DC | PRN
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
## 2015-07-29 ENCOUNTER — Emergency Department (HOSPITAL_COMMUNITY)
Admission: EM | Admit: 2015-07-29 | Discharge: 2015-07-29 | Disposition: A | Discharge status: Home / Self Care | Payer: Medicaid Other | Attending: Emergency Medicine | Admitting: Emergency Medicine

## 2015-07-29 ENCOUNTER — Encounter (HOSPITAL_COMMUNITY): Payer: Self-pay | Admitting: Emergency Medicine

## 2015-07-29 DIAGNOSIS — M545 Low back pain: Secondary | ICD-10-CM | POA: Diagnosis present

## 2015-07-29 DIAGNOSIS — Z791 Long term (current) use of non-steroidal anti-inflammatories (NSAID): Secondary | ICD-10-CM | POA: Insufficient documentation

## 2015-07-29 DIAGNOSIS — Z87828 Personal history of other (healed) physical injury and trauma: Secondary | ICD-10-CM | POA: Diagnosis not present

## 2015-07-29 DIAGNOSIS — M62830 Muscle spasm of back: Secondary | ICD-10-CM

## 2015-07-29 MED ORDER — ACETAMINOPHEN 500 MG PO TABS: 500.0000 mg | ORAL_TABLET | Freq: Four times a day (QID) | ORAL | Status: DC | PRN

## 2015-07-29 MED ORDER — ACETAMINOPHEN 325 MG PO TABS
650.0000 mg | ORAL_TABLET | Freq: Once | ORAL | Status: AC
  Administered 2015-07-29: 650 mg via ORAL
  Filled 2015-07-29: qty 2

## 2015-07-29 MED ORDER — IBUPROFEN 800 MG PO TABS: 800.0000 mg | ORAL_TABLET | Freq: Three times a day (TID) | ORAL | Status: DC

## 2015-07-29 MED ORDER — METHOCARBAMOL 500 MG PO TABS: 500.0000 mg | ORAL_TABLET | Freq: Two times a day (BID) | ORAL | Status: DC
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
## 2015-07-29 NOTE — Discharge Instructions (Signed)
Alternate tylenol every 6 hours and ibuprofen every 3 hours for pain control. Continue taking the muscle relaxer and using your heating pad. Schedule a follow up appointment with PCP if symptoms do not improve.    Ejercicios para la espalda (Back Exercises) Los siguientes ejercicios fortalecen los msculos que dan soporte a la espalda y, [place], ayudan a Pharmacologist la flexibilidad de la zona lumbar. Hacer estos ejercicios puede ser de ayuda para Psychologist, sport and exercise de espalda o Engineer, materials actual. Si tiene dolor o molestias en la espalda, intente hacer estos ejercicios 2 o 3veces por da, o como se lo haya indicado el mdico. Cuando el dolor desaparezca, hgalos una vez por da, pero haga ms repeticiones de cada ejercicio. Si no tiene dolor o [employer], haga estos ejercicios una vez por da o como se lo haya indicado el mdico. EJERCICIOS Rodilla al pecho Repita estos pasos 3 o 5veces con cada pierna: 1. Acustese boca arriba sobre una cama dura o sobre el suelo con las piernas extendidas. 2. Lleve una rodilla al pecho. La otra pierna debe quedar extendida y en contacto con el suelo. 3. Mantenga la rodilla contra el pecho. Para lograrlo tmese la rodilla o el muslo. 4. Tire de la rodilla hasta sentir una elongacin suave en la parte baja de la espalda. 5. Mantenga la elongacin durante 10 a 30segundos. 6. Suelte y extienda la pierna lentamente. Inclinacin de la pelvis Repita estos pasos 5 o 10veces: 1. Acustese boca arriba sobre una cama dura o sobre el suelo con las piernas extendidas. 2. Flexione las rodillas de modo que apunten al techo y los pies queden apoyados en el suelo. 3. Contraiga los msculos de la parte baja del abdomen para empujar la zona lumbar contra el suelo. Con este movimiento se inclinar la pelvis de modo que el cccix apunte hacia el techo, en lugar de apuntar en direccin a los pies o al suelo. 4. Contraiga suavemente y respire con normalidad mientras  mantiene esta posicin durante 5 a 10segundos. El perro y el gato Repita estos pasos hasta que la zona lumbar se vuelva ms flexible: 1. Apoye las palmas de las manos y las rodillas sobre una superficie firme. Las manos deben estar alineadas con los hombros y las rodillas con las caderas. Puede colocarse almohadillas debajo de las rodillas para estar cmodo. 2. Deje caer la cabeza y baje el cccix en direccin al suelo de modo que la zona lumbar se arquee como el lomo de un gato [place]. 3. Mantenga esta posicin durante 5segundos. 4. Lentamente, levante la cabeza y eleve el cccix de modo que apunte en direccin al techo para que la espalda forme un arco hundido como el lomo de un perro contento. 5. Mantenga esta posicin durante 5segundos. Flexiones de brazos Repita estos pasos 5 o 10veces: 1. Acustese boca abajo en el suelo. 2. Coloque las palmas de las manos cerca de la cabeza, separadas aproximadamente al ancho de los hombros. 3. Con la espalda lo ms relajada posible y las caderas apoyadas en el suelo, extienda lentamente los brazos para levantar la mitad superior del cuerpo y Optometrist los hombros. No use los msculos de la espalda para elevar la parte superior del torso. Puede cambiar las manos de lugar para estar ms cmodo. 4. Mantenga esta posicin durante 5segundos mientras mantiene la espalda relajada. 5. Lentamente vuelva a la posicin horizontal. Puentes Repita estos pasos 10veces: 1. Acustese boca arriba sobre una superficie firme. 2. Flexione las rodillas  de modo que apunten al techo y los pies queden apoyados en el suelo. 3. Contraiga los glteos y despegue las nalgas del suelo hasta que la cintura est casi a la misma altura que las rodillas. Debe sentir el trabajo muscular en las nalgas y la parte posterior de los muslos. Si no siente el esfuerzo de [employer], aleje los pies 1 o 2pulgadas (2,5 o 5centmetros) de las nalgas. 4. Mantenga esta posicin durante 3 a  5segundos. 5. Baje lentamente las caderas a la posicin inicial y relaje los glteos por completo. Si este ejercicio le resulta muy fcil, intente realizarlo con los brazos cruzados [place]. Abdominales Repita estos pasos 5 o 10veces: 1. Acustese boca arriba sobre una cama dura o sobre el suelo con las piernas extendidas. 2. Flexione las rodillas de modo que apunten al techo y los pies queden apoyados en el suelo. 3. Cruce los [employer]. 4. Baje levemente el mentn en direccin al pecho sin doblar el cuello. 5. Contraiga los msculos del abdomen y con lentitud eleve el torso lo suficiente como para Artist los omplatos del suelo. No eleve el torso ms alto que eso, porque esto puede sobreexigir a la zona lumbar y no ayuda a Investment banker, operational. 6. Regrese lentamente a la posicin inicial. Elevaciones de espalda Repita estos pasos 5 o 10veces: 1. Acustese boca abajo con los brazos a los costados del cuerpo y apoye la frente en el suelo. 2. Contraiga los msculos de las piernas y los glteos. 3. Lentamente despegue el pecho del suelo [employer] las caderas bien apoyadas en el suelo. Mantenga la nuca alineada con la curvatura de la espalda. Los ojos deben mirar al suelo. 4. Mantenga esta posicin durante 3 a 5segundos. 5. Regrese lentamente a la posicin inicial. SOLICITE ATENCIN MDICA SI:  El dolor o las molestias en la espalda se vuelven mucho ms intensos cuando hace un ejercicio.  El dolor o las molestias en la espalda no se Copy en el trmino de las 2horas posteriores a Copy. Si tiene alguno de [employer], deje de [employer] ejercicios de inmediato. No vuelva a hacer los ejercicios a menos que el mdico lo autorice. SOLICITE ATENCIN MDICA DE INMEDIATO SI:  Siente un dolor sbito e intenso en la espalda. Si esto ocurre, deje de [employer] ejercicios de inmediato. No vuelva a hacer los ejercicios a menos  que el mdico lo autorice.   Esta informacin no tiene Theme park manager el consejo del mdico. Asegrese de hacerle al mdico cualquier pregunta que tenga.   Document Released: 06/27/2005 Document Revised: 03/18/2015 Elsevier Interactive Patient Education 2016 [employer].  Dolor de espalda en adultos  (Back Pain, Adult)  El dolor de espalda es muy frecuente en los adultos. La causa del dolor de espalda es rara vez peligrosa y Chief Technology Officer a menudo mejora con el [place]. Es posible que se desconozca la causa de esta afeccin. Algunas causas comunes son las siguientes:  Distensin de los msculos o ligamentos que sostienen la columna vertebral.  Chiropractor (degeneracin) de los discos vertebrales.  Artritis.    Lesiones directas en la espalda. En [employer], el dolor de espalda es recurrente. Como rara vez es peligroso, las personas pueden aprender a Psychologist, clinical afeccin por s mismas.  INSTRUCCIONES PARA EL CUIDADO EN EL HOGAR  Controle su dolor de espalda a fin de Public house manager cambio. Las siguientes indicaciones ayudarn a Psychologist, educational [employer] pueda sentir:  Permanezca activo. Si permanece sentado o de pie en un mismo lugar durante mucho tiempo, se tensiona la espalda. No se siente, conduzca o permanezca de pie en un mismo lugar durante ms de 30 minutos seguidos. Realice caminatas cortas en superficies planas tan pronto como le sea posible. Trate de caminar un poco ms de Pharmacist, community.  Haga ejercicio regularmente como se lo haya indicado el mdico. El ejercicio ayuda a que su espalda se cure ms rpidamente. Tambin ayuda a prevenir futuras lesiones al [employer] fuertes y flexibles.  No permanezca en la cama. Si hace reposo ms de 1 a 2 das, puede demorar su recuperacin.  Preste atencin a su cuerpo al inclinarse y levantarse. Las posiciones ms cmodas son las que ejercen menos tensin en la espalda en recuperacin. Siempre use tcnicas apropiadas para  levantar objetos, como por ejemplo:  Flexionar las rodillas.  Mantener la carga cerca del cuerpo.  No torcerse. Encuentre una posicin cmoda para dormir. Use un colchn firme y recustese de costado con las rodillas ligeramente flexionadas. Si se recuesta [employer], coloque una almohada debajo de las rodillas.  Evite sentir ansiedad o estrs. El estrs aumenta la tensin muscular y puede empeorar el dolor de espalda. Es importante reconocer si se siente ansioso o estresado y aprender maneras de controlarlo, por ejemplo haciendo ejercicio.  Tome los medicamentos solamente como se lo haya indicado el mdico. Los medicamentos de venta libre para Engineer, materials y la inflamacin a menudo son los ms eficaces. El mdico puede recetarle relajantes musculares. Estos medicamentos ayudan a Primary school teacher de modo que pueda reanudar ms rpidamente sus actividades normales y el ejercicio saludable.  Aplique hielo sobre la zona lesionada.  Ponga el hielo en una bolsa plstica.  Coloque una toalla entre la piel y la bolsa de hielo.  Deje el hielo durante 20 minutos, 2 a 3 veces por da, durante los primeros 2 o 3 [address]  [address]. Despus de eso, puede alternar el hielo y el calor para reducir Chief Technology Officer y los espasmos. Mantenga un peso saludable. El exceso de peso ejerce presin adicional sobre la espalda y hace que resulte difcil mantener una buena [place]. SOLICITE ATENCIN MDICA SI:  Siente un dolor que no se alivia con reposo o medicamentos.  Siente mucho dolor que se extiende a las piernas o los glteos.  El dolor no mejora en una semana.  Siente dolor por la noche.  Pierde peso.  Siente escalofros o fiebre. SOLICITE ATENCIN MDICA DE INMEDIATO SI:  Tiene nuevos problemas para controlar la vejiga o los intestinos.  Siente debilidad o adormecimiento inusuales en los brazos o en las piernas.  Siente nuseas o vmitos.  Siente dolor abdominal.  Siente que va a desmayarse. Esta informacin no tiene  Theme park manager el consejo del mdico. Asegrese de hacerle al mdico cualquier pregunta que tenga.  Document Released: 06/27/2005 Document Revised: 07/18/2014  Elsevier Interactive Patient Education [employer]

## 2015-08-29 ENCOUNTER — Encounter (HOSPITAL_COMMUNITY): Payer: Self-pay | Admitting: Nurse Practitioner

## 2015-08-29 ENCOUNTER — Emergency Department (HOSPITAL_COMMUNITY)
Admission: EM | Admit: 2015-08-29 | Discharge: 2015-08-29 | Disposition: A | Discharge status: Home / Self Care | Payer: Medicaid Other | Attending: Physician Assistant | Admitting: Physician Assistant

## 2015-08-29 DIAGNOSIS — Z79899 Other long term (current) drug therapy: Secondary | ICD-10-CM | POA: Insufficient documentation

## 2015-08-29 DIAGNOSIS — M79641 Pain in right hand: Secondary | ICD-10-CM | POA: Diagnosis present

## 2015-08-29 DIAGNOSIS — Z791 Long term (current) use of non-steroidal anti-inflammatories (NSAID): Secondary | ICD-10-CM | POA: Diagnosis not present

## 2015-08-29 DIAGNOSIS — G5601 Carpal tunnel syndrome, right upper limb: Secondary | ICD-10-CM | POA: Diagnosis not present

## 2015-08-29 HISTORY — DX: Carpal tunnel syndrome, unspecified upper limb: G56.00

## 2015-08-29 NOTE — Discharge Instructions (Signed)
Sndrome del tnel carpiano (Carpal Tunnel Syndrome) El sndrome del tnel carpiano es una afeccin que causa dolor en la mano y en el brazo. El tnel carpiano es un espacio estrecho ubicado en el lado palmar de la [employer]. Los movimientos repetidos de la mueca o determinadas enfermedades pueden causar la hinchazn del tnel. Esta hinchazn puede comprimir el nervio principal de la mueca (nervio mediano).  CUIDADOS EN EL HOGAR Si tiene una frula:  sela como se lo haya indicado el mdico. Qutesela solamente como se lo haya indicado el mdico.  Afloje la frula si los dedos:  Se le adormecen y siente hormigueos.  Se le ponen azulados y fros.  Mantenga la frula limpia y seca. Instrucciones generales  [employer] de venta libre y los recetados solamente como se lo haya indicado el mdico.  Haga reposar la [place] de toda actividad que le cause dolor. Si es necesario, hable con su empleador [employer] cambios que pueden hacerse en su lugar de [place], por ejemplo, usar una almohadilla para apoyar la mueca mientras tipea.  Si se lo indican, aplique hielo sobre la zona dolorida:  Ponga el hielo en una bolsa plstica.  Coloque una toalla entre la piel y la bolsa de hielo.  Coloque el hielo durante , 2 a 3veces por Futures trader.  Concurra a todas las visitas de control como se lo haya indicado el mdico. Esto es importante.  Haga los ejercicios como se lo hayan indicado el mdico, el fisioterapeuta o el terapeuta ocupacional. SOLICITE AYUDA SI:  Aparecen nuevos sntomas.  Los medicamentos no Tourist information centre manager.  Los sntomas empeoran.   Esta informacin no tiene Theme park manager el consejo del mdico. Asegrese de hacerle al mdico cualquier pregunta que tenga.   Document Released: 06/16/2011 Document Revised: 03/18/2015 Elsevier Interactive Patient Education [employer]

## 2015-08-29 NOTE — ED Provider Notes (Signed)
CSN: [id number]     Arrival date & time 08/29/15  1754 History  By signing my name below, I, Elon Spanner, attest that this documentation has been prepared under the direction and in the presence of Kerrie Buffalo, NP. Electronically Signed: Elon Spanner ED Scribe. 08/29/2015. 6:10 PM.    Chief Complaint  Patient presents with  . Hand Pain   Patient is a 45 y.o. female presenting with hand pain. The history is provided by the patient.  Hand Pain This is a recurrent problem. The problem has been gradually worsening. The symptoms are aggravated by bending. The symptoms are relieved by acetaminophen. Treatments tried: wrist splint.   HPI Comments: Toni Hall is a 45 y.o. female with hx of carpal tunnel who presents to the Emergency Department complaining of chronic right hand tingling and new decreased grip strength over the past several days.  She denies hand pain.  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  The patient is also concerned over getting a work note because she does not want to lose her job.  She had a steroid injection last week by Dr. Charlesetta Shanks for her carpal tunnel and is scheduled to have her condition addressed surgically on 2/20.    Past Medical History  Diagnosis Date  . Carpal tunnel syndrome    Past Surgical History  Procedure Laterality Date  . Hernia repair     History reviewed. No pertinent family history. Social History  Substance Use Topics  . Smoking status: Never Smoker   . Smokeless tobacco: None  . Alcohol Use: No   OB History    No data available     Review of Systems A complete 10 system review of systems was obtained and all systems are negative except as noted in the HPI and PMH.   Allergies  Review of patient's allergies indicates no known allergies.  Home Medications   Prior to Admission medications   Medication Sig Start Date End Date  Taking? Authorizing Provider  acetaminophen (TYLENOL) 500 MG tablet Take 1 tablet (500 mg total) by mouth every 6 (six) hours as needed. 07/29/15   Stevi Barrett, PA-C  cyclobenzaprine (FLEXERIL) 10 MG tablet Take 1 tablet (10 mg total) by mouth 2 (two) times daily as needed for muscle spasms. 07/20/15   Roxy Horseman, PA-C  dicyclomine (BENTYL) 20 MG tablet Take 1 tablet (20 mg total) by mouth 2 (two) times daily as needed for spasms. 03/18/15   Mercedes Camprubi-Soms, PA-C  HYDROcodone-acetaminophen (NORCO) 5-325 MG per tablet Take 1 tablet by mouth every 6 (six) hours as needed for severe pain. 03/18/15   Mercedes Camprubi-Soms, PA-C  ibuprofen (ADVIL,MOTRIN) 800 MG tablet Take 1 tablet (800 mg total) by mouth 3 (three) times daily. 07/20/15   Roxy Horseman, PA-C  ibuprofen (ADVIL,MOTRIN) 800 MG tablet Take 1 tablet (800 mg total) by mouth 3 (three) times daily. 07/29/15   Stevi Barrett, PA-C  methocarbamol (ROBAXIN) 500 MG tablet Take 1 tablet (500 mg total) by mouth 2 (two) times daily. 07/29/15   Stevi Barrett, PA-C  naproxen (NAPROSYN) 500 MG tablet Take 1 tablet (500 mg total) by mouth 2 (two) times daily as needed for mild pain, moderate pain or headache (TAKE WITH MEALS.). 03/18/15   Mercedes Camprubi-Soms, PA-C  ondansetron (ZOFRAN) 8 MG tablet Take 1 tablet (8 mg total) by mouth every 8 (eight) hours as needed for  nausea or vomiting. 03/18/15   Mercedes Camprubi-Soms, PA-C   BP 121/84 mmHg  Pulse 76  Temp(Src) 98 F (36.7 C) (Oral)  Resp 17  Wt 55.792 kg  SpO2 98% Physical Exam  Constitutional: She is oriented to person, place, and time. She appears well-developed and well-nourished. No distress.  HENT:  Head: Normocephalic and atraumatic.  Eyes: Conjunctivae and EOM are normal.  Neck: Neck supple. No tracheal deviation present.  Cardiovascular: Normal rate.   Pulmonary/Chest: Effort normal. No respiratory distress.  Musculoskeletal: Normal range of motion.  Adequate circulation.   Positive Tinel's sign.  Pain that radiates up the forearm with palpation and ROM of the wrist.  Radial pulse 2+.  FROM of wrist.    Neurological: She is alert and oriented to person, place, and time.  Skin: Skin is warm and dry.  Psychiatric: She has a normal mood and affect. Her behavior is normal.  Nursing note and vitals reviewed.   ED Course  Procedures (including critical care time)  DIAGNOSTIC STUDIES: Oxygen Saturation is 100% on RA, normal by my interpretation.    COORDINATION OF CARE:  6:29 PM Discussed plan to provide wrist splint, ice and Tylenol.  Patient should f/u with Dr. Charlesetta Shanks as scheduled.  Patient acknowledges and agrees with plan.     MDM  45 y.o. female with right wrist pain and currently awaiting surgery for carpal tunnel stable for d/c without focal neuro deficits. Discussed with the patient plan of care and all questioned fully answered. She will keep her appointment for her scheduled surgery in 3 days.    Final diagnoses:  Carpal tunnel syndrome of right wrist  \ I personally performed the services described in this documentation, which was scribed in my presence. The recorded information has been reviewed and is accurate.    [facility] Orlene Och, NP 09/03/15 2354  Courteney Randall An, MD 09/08/15 [phone]

## 2015-08-29 NOTE — ED Notes (Signed)
Pt reports hx of pain and carpal tunnel to R hand. She received an injection for pain last week and is scheduled for follow up next week but today at work she began to have more severe pain and numbness in the R hand and the hand "Felt heavy" so her employer sent her for medical evaluation prior to returning to work.

## 2015-08-29 NOTE — ED Notes (Signed)
Declined W/C at D/C and was escorted to lobby by RN. 

## 2015-08-29 NOTE — ED Notes (Signed)
Pt sent to ED by boss becaujse of pain in Rt hand and needs a work note.

## 2015-12-12 ENCOUNTER — Ambulatory Visit (HOSPITAL_COMMUNITY)
Admission: EM | Admit: 2015-12-12 | Discharge: 2015-12-12 | Disposition: A | Discharge status: Nursing Home (Includes ICF) | Payer: Medicaid Other | Attending: Family Medicine | Admitting: Family Medicine

## 2015-12-12 ENCOUNTER — Emergency Department (HOSPITAL_COMMUNITY)
Admission: EM | Admit: 2015-12-12 | Discharge: 2015-12-12 | Disposition: A | Discharge status: Home / Self Care | Payer: Medicaid Other | Attending: Emergency Medicine | Admitting: Emergency Medicine

## 2015-12-12 ENCOUNTER — Emergency Department (HOSPITAL_COMMUNITY): Payer: Medicaid Other

## 2015-12-12 ENCOUNTER — Encounter (HOSPITAL_COMMUNITY): Payer: Self-pay

## 2015-12-12 ENCOUNTER — Encounter (HOSPITAL_COMMUNITY): Payer: Self-pay | Admitting: *Deleted

## 2015-12-12 DIAGNOSIS — Z791 Long term (current) use of non-steroidal anti-inflammatories (NSAID): Secondary | ICD-10-CM | POA: Diagnosis not present

## 2015-12-12 DIAGNOSIS — Z7982 Long term (current) use of aspirin: Secondary | ICD-10-CM | POA: Diagnosis not present

## 2015-12-12 DIAGNOSIS — R110 Nausea: Secondary | ICD-10-CM | POA: Diagnosis not present

## 2015-12-12 DIAGNOSIS — Z3202 Encounter for pregnancy test, result negative: Secondary | ICD-10-CM | POA: Insufficient documentation

## 2015-12-12 DIAGNOSIS — N83201 Unspecified ovarian cyst, right side: Secondary | ICD-10-CM | POA: Insufficient documentation

## 2015-12-12 DIAGNOSIS — R3915 Urgency of urination: Secondary | ICD-10-CM | POA: Diagnosis not present

## 2015-12-12 DIAGNOSIS — Z8669 Personal history of other diseases of the nervous system and sense organs: Secondary | ICD-10-CM | POA: Insufficient documentation

## 2015-12-12 DIAGNOSIS — R1031 Right lower quadrant pain: Secondary | ICD-10-CM | POA: Diagnosis present

## 2015-12-12 DIAGNOSIS — R300 Dysuria: Secondary | ICD-10-CM | POA: Diagnosis not present

## 2015-12-12 LAB — COMPREHENSIVE METABOLIC PANEL
ALT: 16 U/L (ref 14–54)
AST: 14 U/L — ABNORMAL LOW (ref 15–41)
Albumin: 3.6 g/dL (ref 3.5–5.0)
Alkaline Phosphatase: 57 U/L (ref 38–126)
Anion gap: 8 (ref 5–15)
BUN: 5 mg/dL — ABNORMAL LOW (ref 6–20)
CO2: 24 mmol/L (ref 22–32)
Calcium: 9.3 mg/dL (ref 8.9–10.3)
Chloride: 105 mmol/L (ref 101–111)
Creatinine, Ser: 0.72 mg/dL (ref 0.44–1.00)
GFR calc Af Amer: >60 mL/min (ref >60)
GFR calc non Af Amer: >60 mL/min (ref >60)
Glucose, Bld: 95 mg/dL (ref 65–99)
Potassium: 3.6 mmol/L (ref 3.5–5.1)
Sodium: 137 mmol/L (ref 135–145)
Total Bilirubin: 0.9 mg/dL (ref 0.3–1.2)
Total Protein: 7.4 g/dL (ref 6.5–8.1)

## 2015-12-12 LAB — URINALYSIS, ROUTINE W REFLEX MICROSCOPIC
Bilirubin Urine: NEGATIVE
GLUCOSE, UA: NEGATIVE mg/dL
Hgb urine dipstick: NEGATIVE
Ketones, ur: NEGATIVE mg/dL
LEUKOCYTES UA: NEGATIVE
NITRITE: NEGATIVE
PROTEIN: NEGATIVE mg/dL
Specific Gravity, Urine: 1.011 (ref 1.005–1.030)
pH: 6 (ref 5.0–8.0)

## 2015-12-12 LAB — WET PREP, GENITAL
Clue Cells Wet Prep HPF POC: NONE SEEN
SPERM: NONE SEEN
Trich, Wet Prep: NONE SEEN
YEAST WET PREP: NONE SEEN

## 2015-12-12 LAB — POCT URINALYSIS DIP (DEVICE)
Bilirubin Urine: NEGATIVE
GLUCOSE, UA: NEGATIVE mg/dL
Hgb urine dipstick: NEGATIVE
Ketones, ur: NEGATIVE mg/dL
LEUKOCYTES UA: NEGATIVE
Nitrite: NEGATIVE
Protein, ur: NEGATIVE mg/dL
Specific Gravity, Urine: 1.01 (ref 1.005–1.030)
Urobilinogen, UA: 0.2 mg/dL (ref 0.0–1.0)
pH: 7 (ref 5.0–8.0)

## 2015-12-12 LAB — POCT PREGNANCY, URINE: Preg Test, Ur: NEGATIVE

## 2015-12-12 LAB — CBC
HCT: 42.8 % (ref 36.0–46.0)
Hemoglobin: 13.6 g/dL (ref 12.0–15.0)
MCH: 25.7 pg — ABNORMAL LOW (ref 26.0–34.0)
MCHC: 31.8 g/dL (ref 30.0–36.0)
MCV: 80.9 fL (ref 78.0–100.0)
PLATELETS: 275 10*3/uL (ref 150–400)
RBC: 5.29 MIL/uL — ABNORMAL HIGH (ref 3.87–5.11)
RDW: 13.5 % (ref 11.5–15.5)
WBC: 7.9 10*3/uL (ref 4.0–10.5)

## 2015-12-12 LAB — I-STAT BETA HCG BLOOD, ED (MC, WL, AP ONLY): I-stat hCG, quantitative: <5 m[IU]/mL (ref <5)

## 2015-12-12 LAB — LIPASE, BLOOD: Lipase: 31 U/L (ref 11–51)

## 2015-12-12 MED ORDER — IBUPROFEN 800 MG PO TABS: 800.0000 mg | ORAL_TABLET | Freq: Three times a day (TID) | ORAL | Status: DC

## 2015-12-12 NOTE — ED Notes (Signed)
Assessment per Dr. Kindl. 

## 2015-12-12 NOTE — ED Notes (Signed)
Report called to Keith, ED Triage RN. 

## 2015-12-12 NOTE — Discharge Instructions (Signed)
Quiste ovrico (Ovarian Cyst) Un quiste ovrico es una bolsa llena de lquido que se forma en el ovario. Los ovarios son los rganos pequeos que producen vulos en las mujeres. Se pueden formar varios tipos de quistes en los ovarios. La mayora no son cancerosos. Muchos de ellos no causan problemas y con frecuencia desaparecen solos. Algunos pueden provocar sntomas y requerir tratamiento. Los tipos ms comunes de quistes ovricos son los siguientes:  Quistes funcionales: estos quistes pueden aparecer todos los meses durante el ciclo menstrual. Esto es normal. Estos quistes suelen desaparecer con el prximo ciclo menstrual si la mujer no queda embarazada. En general, los quistes funcionales no tienen sntomas.  Endometriomas: estos quistes se forman a partir del tejido que recubre el tero. Tambin se denominan "quistes de chocolate" porque se llenan de sangre que se vuelve marrn. Este tipo de quiste puede provocar dolor en la zona inferior del abdomen durante la relacin sexual y con el perodo menstrual.  Cistoadenomas: este tipo se desarrolla a partir de las clulas que se ubican en el exterior del ovario. Estos quistes pueden ser muy grandes y causar dolor en la zona inferior del abdomen y durante la relacin sexual. Este tipo de quiste puede girar sobre s mismo, cortar el suministro de sangre y causar un dolor intenso. Tambin se puede romper con facilidad y provocar mucho dolor.  Quistes dermoides: este tipo de quiste a veces se encuentra en ambos ovarios. Estos quistes pueden contener diferentes tipos de tejidos del organismo, como piel, dientes, pelo o cartlago. Generalmente no tienen sntomas, a menos que sean muy grandes.  Quistes tecalutenicos: aparecen cuando se produce demasiada cantidad de cierta hormona (gonadotropina corinica humana) que estimula en exceso al ovario para que produzca vulos. Esto es ms frecuente despus de procedimientos que ayudan a la concepcin de un beb  (fertilizacin in vitro). CAUSAS   Los medicamentos para la fertilidad pueden provocar una afeccin mediante la cual se forman mltiples quistes de gran tamao en los ovarios. Esta se denomina sndrome de hiperestimulacin ovrica.  El sndrome del ovario poliqustico es una afeccin que puede causar desequilibrios hormonales, los cuales pueden dar como resultado quistes ovricos no funcionales. SIGNOS Y SNTOMAS  Muchos quistes ovricos no causan sntomas. Si se presentan sntomas, stos pueden ser:  Dolor o molestias en la pelvis.  Dolor en la parte baja del abdomen.  Dolor durante las relaciones sexuales.  Aumento del permetro abdominal (hinchazn).  Perodos menstruales anormales.  Aumento del dolor en los perodos menstruales.  Cese de los perodos menstruales sin estar embarazada. DIAGNSTICO  Estos quistes se descubren comnmente durante un examen de rutina o una exploracin ginecolgica anual. Es posible que se ordenen otros estudios para obtener ms informacin sobre el quiste. Estos estudios pueden ser:  Ecografas.  Radiografas de la pelvis.  Tomografa computada.  Resonancia magntica.  Anlisis de sangre. TRATAMIENTO  Muchos de los quistes ovricos desaparecen por s solos, sin tratamiento. Es probable que el mdico quiera controlar el quiste regularmente durante 2 o 3meses para ver si se produce algn cambio. En el caso de las mujeres en la menopausia, es particularmente importante controlar de cerca al quiste ya que el ndice de cncer de ovario en las mujeres menopusicas es ms alto. Cuando se requiere tratamiento, este puede incluir cualquiera de los siguientes:  Un procedimiento para drenar el quiste (aspiracin). Esto se puede realizar mediante el uso de una aguja grande y una ecografa. Tambin se puede hacer a travs de un procedimiento laparoscpico, En   este procedimiento, se inserta un tubo delgado que emite luz y que tiene una pequea cmara en un  extremo (laparoscopio) a travs de una pequea incisin.  Ciruga para extirpar el quiste completo. Esto se puede realizar mediante una ciruga laparoscpica o una ciruga abierta, la cual implica realizar una incisin ms grande en la parte inferior del abdomen.  Tratamiento hormonal o pldoras anticonceptivas. Estos mtodos a veces se usan para ayudar a disolver un quiste. INSTRUCCIONES PARA EL CUIDADO EN EL HOGAR   Tome solo medicamentos de venta libre o recetados, segn las indicaciones del mdico.  Concurra a las consultas de control con su mdico segn las indicaciones.  Hgase exmenes plvicos regulares y pruebas de [place]apan[place]. SOLICITE ATENCIN MDICA SI:   Los perodos se atrasan, son irregulares, dolorosos o cesan.  El dolor plvico o abdominal no desaparece.  El abdomen se agranda o se hincha.  Siente presin en la vejiga o no puede vaciarla completamente.  Siente dolor durante las relaciones sexuales.  Tiene una sensacin de hinchazn, presin o molestias en el estmago.  Pierde peso sin razn aparente.  Siente un malestar generalizado.  Est estreida.  Pierde el apetito.  Le aparece acn.  Nota un aumento del vello corporal y facial.  Aumenta de peso sin hacer modificaciones en su actividad fsica y en su dieta habitual.  Sospecha que est embarazada. SOLICITE ATENCIN MDICA DE INMEDIATO SI:   Siente cada vez ms dolor abdominal.  Tiene malestar estomacal (nuseas) y vomita.  Tiene fiebre que se presenta de manera repentina.  Siente dolor abdominal al defecar.  Sus perodos menstruales son ms abundantes que lo habitual. ASEGRESE DE QUE:   Comprende estas instrucciones.  Controlar su afeccin.  Recibir ayuda de inmediato si no mejora o si empeora.   Esta informacin no tiene como fin reemplazar el consejo del mdico. Asegrese de hacerle al mdico cualquier pregunta que tenga.   Document Released: 04/06/2005 Document Revised:  07/02/2013 Elsevier Interactive Patient Education 2016 Elsevier Inc.  

## 2015-12-12 NOTE — ED Provider Notes (Signed)
CSN: [id number]     Arrival date & time 12/12/15  1505 History   First MD Initiated Contact with Patient 12/12/15 1713     Chief Complaint  Patient presents with  . Abdominal Pain   HPI  Ms. Toni Hall is a 45 year old female with PMHx of CTS presenting with abdominal pain. Pt is accompanied by her husband who translates for her. She was seen at urgent care earlier today and sent to ED for evaluation of RLQ pain with rule out of appendicitis vs ovarian cyst. Pt reports intermittent RLQ abdominal pain of the past few weeks with acute worsening over the past day. She describes the pain as pinching and sharp. The pain radiates from her RLQ to her right lower back. Movement exacerbates the pain. Denies alleviating factors. She endorses associated subjective fevers, chills, nausea and dysuria. She states that she has burning pain at the end of urination and malodorous urine. Denies vaginal discharge, vaginal bleeding or pelvic pain. She has not taken any OTC medications PTA.   Past Medical History  Diagnosis Date  . Carpal tunnel syndrome    Past Surgical History  Procedure Laterality Date  . Hernia repair     No family history on file. Social History  Substance Use Topics  . Smoking status: Never Smoker   . Smokeless tobacco: None  . Alcohol Use: No   OB History    No data available     Review of Systems  All other systems reviewed and are negative.     Allergies  Review of patient's allergies indicates no known allergies.  Home Medications   Prior to Admission medications   Medication Sig Start Date End Date Taking? Authorizing Provider  acetaminophen (TYLENOL) 500 MG tablet Take 1 tablet (500 mg total) by mouth every 6 (six) hours as needed. Patient taking differently: Take 500 mg by mouth every 6 (six) hours as needed for mild pain.  07/29/15  Yes Jesusita Jocelyn, PA-C  aspirin 325 MG EC tablet Take 325 mg by mouth daily as needed for pain.   Yes Historical Provider, MD   ibuprofen (ADVIL,MOTRIN) 800 MG tablet Take 1 tablet (800 mg total) by mouth 3 (three) times daily. 07/29/15  Yes Nikesh Teschner, PA-C  ibuprofen (ADVIL,MOTRIN) 800 MG tablet Take 1 tablet (800 mg total) by mouth 3 (three) times daily. 12/12/15   Jacquiline Zurcher, PA-C   BP 126/75 mmHg  Pulse 72  Temp(Src) 98.9 F (37.2 C) (Oral)  Resp 18  Ht 5\' 3"  (1.6 m)  Wt 55.792 kg  BMI 21.79 kg/m2  SpO2 98%  LMP 11/22/2015 (Approximate) Physical Exam  Constitutional: She appears well-developed and well-nourished. No distress.  Nontoxic appearing  HENT:  Head: Normocephalic and atraumatic.  Eyes: Conjunctivae are normal. Right eye exhibits no discharge. Left eye exhibits no discharge. No scleral icterus.  Neck: Normal range of motion.  Cardiovascular: Normal rate, regular rhythm and normal heart sounds.   Pulmonary/Chest: Effort normal and breath sounds normal. No respiratory distress.  Abdominal: Soft. Normal appearance and bowel sounds are normal. There is tenderness in the right lower quadrant. There is no rigidity, no rebound, no guarding and no CVA tenderness. Hernia confirmed negative in the right inguinal area and confirmed negative in the left inguinal area.  Abdomen is soft with focal tenderness in the RLQ. No rebound or rigidity. Mild, voluntary guarding. No CVA tenderness.   Genitourinary: There is no rash on the right labia. There is no rash on the left  labia. Cervix exhibits no motion tenderness, no discharge and no friability. Right adnexum displays tenderness. Right adnexum displays no mass. Left adnexum displays no mass and no tenderness. No bleeding in the vagina. No vaginal discharge found.  No vaginal discharge noted. Tenderness on palpation of the right adnexal region. No palpable masses. No left adnexal tenderness. No cervical motion tenderness or discharge from the cervix.  Musculoskeletal: Normal range of motion.  Neurological: She is alert. Coordination normal.  Skin: Skin is warm  and dry.  Psychiatric: She has a normal mood and affect. Her behavior is normal.  Nursing note and vitals reviewed.   ED Course  Procedures (including critical care time) Labs Review Labs Reviewed  WET PREP, GENITAL - Abnormal; Notable for the following:    WBC, Wet Prep HPF POC MANY (*)    All other components within normal limits  COMPREHENSIVE METABOLIC PANEL - Abnormal; Notable for the following:    BUN 5 (*)    AST 14 (*)    All other components within normal limits  CBC - Abnormal; Notable for the following:    RBC 5.29 (*)    MCH 25.7 (*)    All other components within normal limits  LIPASE, BLOOD  URINALYSIS, ROUTINE W REFLEX MICROSCOPIC (NOT AT [facility])  I-STAT BETA HCG BLOOD, ED ([facility], [facility], [facility] ONLY)  GC/CHLAMYDIA PROBE AMP ([place]) NOT AT [facility]    Imaging Review US Transvaginal Non-ob  12/12/2015  CLINICAL DATA:  Initial valuation for acute right lower quadrant, right lower back pain with chills. EXAM: TRANSABDOMINAL AND TRANSVAGINAL ULTRASOUND OF PELVIS TECHNIQUE: Both transabdominal and transvaginal ultrasound examinations of the pelvis were performed. Transabdominal technique was performed for global imaging of the pelvis including uterus, ovaries, adnexal regions, and pelvic cul-de-sac. It was necessary to proceed with endovaginal exam following the transabdominal exam to visualize the ovaries. COMPARISON:  Prior CT from 03/18/2015 FINDINGS: Uterus Measurements: 8.8 x 4.5 x 5.6 cm. No fibroids or other mass visualized. Endometrium Thickness: 9.4 mm.  No focal abnormality visualized. Right ovary Measurements: 4.2 x 2.4 x 3.1 cm. 2.5 x 2.1 x 2.0 complex lesion without definite are internal blood flow. Lesion is partially cystic with peripheral hyper echoic echotexture, favored to reflect a corpus luteal cyst. Left ovary Measurements: 2.7 x 1.9 x 1.9 cm. Normal appearance/no adnexal mass. Other findings Trace free fluid, likely physiologic. IMPRESSION: 1. 2.5 x 2.1 x 2.0 cm  complex right ovarian lesion, favored to reflect a degenerating corpus luteal cyst. 2. Trace free fluid within the pelvis, likely physiologic. Electronically Signed   By: Rise Mu M.D.   On: 12/12/2015 21:29   US Pelvis Complete  12/12/2015  CLINICAL DATA:  Initial valuation for acute right lower quadrant, right lower back pain with chills. EXAM: TRANSABDOMINAL AND TRANSVAGINAL ULTRASOUND OF PELVIS TECHNIQUE: Both transabdominal and transvaginal ultrasound examinations of the pelvis were performed. Transabdominal technique was performed for global imaging of the pelvis including uterus, ovaries, adnexal regions, and pelvic cul-de-sac. It was necessary to proceed with endovaginal exam following the transabdominal exam to visualize the ovaries. COMPARISON:  Prior CT from 03/18/2015 FINDINGS: Uterus Measurements: 8.8 x 4.5 x 5.6 cm. No fibroids or other mass visualized. Endometrium Thickness: 9.4 mm.  No focal abnormality visualized. Right ovary Measurements: 4.2 x 2.4 x 3.1 cm. 2.5 x 2.1 x 2.0 complex lesion without definite are internal blood flow. Lesion is partially cystic with peripheral hyper echoic echotexture, favored to reflect a corpus luteal cyst. Left ovary Measurements:  2.7 x 1.9 x 1.9 cm. Normal appearance/no adnexal mass. Other findings Trace free fluid, likely physiologic. IMPRESSION: 1. 2.5 x 2.1 x 2.0 cm complex right ovarian lesion, favored to reflect a degenerating corpus luteal cyst. 2. Trace free fluid within the pelvis, likely physiologic. Electronically Signed   By: Rise Mu M.D.   On: 12/12/2015 21:29   I have personally reviewed and evaluated these images and lab results as part of my medical decision-making.   EKG Interpretation None      MDM   Final diagnoses:  Right ovarian cyst   45 year old female presenting with right lower quadrant pain x multiple weeks with acute worsening the past 2 days. Associated subjective fevers, chills, nausea and dysuria.  Afebrile and hemodynamically stable. Patient is nontoxic-appearing. There is tenderness with voluntary guarding in the right lower quadrant. No rebound or rigidity. On pelvic exam, no vaginal or cervical discharge is noted. There is tenderness on palpation of the right adnexal region without palpable mass. No leukocytosis. CMP is unremarkable. Negative pregnancy test. Urinalysis negative for infection. Wet prep with many white blood cells but no yeast, trichomoniasis or clue cells. She denies concern for STD exposure. Pelvic ultrasound identifies a complex right ovarian lesion favored to be a degenerating corpus luteal cyst. Will discharge patient with pain medication and follow-up with OB/GYN. Patient does not have a PCP. Given referral to admission for the women's clinic and instructed to make a follow-up appointment. Return precautions given in discharge paperwork and discussed with pt at bedside. Pt stable for discharge     Alveta Heimlich, PA-C 12/12/15 2203  Marily Memos, MD 12/12/15 [phone]

## 2015-12-12 NOTE — ED Notes (Signed)
Patient here from urgent care for right lower abdominal pain with back pain.  Spouse reports thjat her urine was ok so here for further evaluation. Pain worse following urination

## 2015-12-12 NOTE — ED Provider Notes (Signed)
CSN: [id number]     Arrival date & time 12/12/15  1303 History   First MD Initiated Contact with Patient 12/12/15 1322     Chief Complaint  Patient presents with  . Abdominal Pain   (Consider location/radiation/quality/duration/timing/severity/associated sxs/prior Treatment) Patient is a 45 y.o. female presenting with abdominal pain. The history is provided by the patient and the spouse. The history is limited by a language barrier. A language interpreter was used (spouse).  Abdominal Pain Pain location:  RLQ and R flank Pain quality: sharp and shooting   Pain radiates to:  R flank Pain severity:  Moderate Onset quality:  Gradual Duration:  2 weeks Progression:  Waxing and waning Chronicity:  New Associated symptoms: chills, dysuria, fever and nausea   Associated symptoms: no vaginal bleeding, no vaginal discharge and no vomiting     Past Medical History  Diagnosis Date  . Carpal tunnel syndrome    Past Surgical History  Procedure Laterality Date  . Hernia repair     No family history on file. Social History  Substance Use Topics  . Smoking status: Never Smoker   . Smokeless tobacco: None  . Alcohol Use: No   OB History    No data available     Review of Systems  Constitutional: Positive for fever and chills.  Gastrointestinal: Positive for nausea and abdominal pain. Negative for vomiting.  Genitourinary: Positive for dysuria, urgency and flank pain. Negative for vaginal bleeding, vaginal discharge and menstrual problem.    Allergies  Review of patient's allergies indicates no known allergies.  Home Medications   Prior to Admission medications   Medication Sig Start Date End Date Taking? Authorizing Provider  ibuprofen (ADVIL,MOTRIN) 800 MG tablet Take 1 tablet (800 mg total) by mouth 3 (three) times daily. 07/29/15  Yes Stevi Barrett, PA-C  acetaminophen (TYLENOL) 500 MG tablet Take 1 tablet (500 mg total) by mouth every 6 (six) hours as needed. 07/29/15   Stevi  Barrett, PA-C  cyclobenzaprine (FLEXERIL) 10 MG tablet Take 1 tablet (10 mg total) by mouth 2 (two) times daily as needed for muscle spasms. 07/20/15   Roxy Horseman, PA-C  dicyclomine (BENTYL) 20 MG tablet Take 1 tablet (20 mg total) by mouth 2 (two) times daily as needed for spasms. 03/18/15   Mercedes Camprubi-Soms, PA-C  HYDROcodone-acetaminophen (NORCO) 5-325 MG per tablet Take 1 tablet by mouth every 6 (six) hours as needed for severe pain. 03/18/15   Mercedes Camprubi-Soms, PA-C  ibuprofen (ADVIL,MOTRIN) 800 MG tablet Take 1 tablet (800 mg total) by mouth 3 (three) times daily. 07/20/15   Roxy Horseman, PA-C  methocarbamol (ROBAXIN) 500 MG tablet Take 1 tablet (500 mg total) by mouth 2 (two) times daily. 07/29/15   Stevi Barrett, PA-C  naproxen (NAPROSYN) 500 MG tablet Take 1 tablet (500 mg total) by mouth 2 (two) times daily as needed for mild pain, moderate pain or headache (TAKE WITH MEALS.). 03/18/15   Mercedes Camprubi-Soms, PA-C  ondansetron (ZOFRAN) 8 MG tablet Take 1 tablet (8 mg total) by mouth every 8 (eight) hours as needed for nausea or vomiting. 03/18/15   Mercedes Camprubi-Soms, PA-C   Meds Ordered and Administered this Visit  Medications - No data to display  BP 126/75 mmHg  Pulse 72  Temp(Src) 98.4 F (36.9 C) (Oral)  SpO2 100%  LMP 11/22/2015 (Approximate) No data found.   Physical Exam  Constitutional: She is oriented to person, place, and time. She appears well-developed and well-nourished. No distress.  HENT:  Right Ear: External ear normal.  Left Ear: External ear normal.  Mouth/Throat: Oropharynx is clear and moist.  Neck: Normal range of motion. Neck supple.  Cardiovascular: Normal rate, normal heart sounds and intact distal pulses.   Pulmonary/Chest: Effort normal and breath sounds normal. No respiratory distress.  Abdominal: Soft. Normal appearance and bowel sounds are normal. She exhibits no distension and no mass. There is tenderness in the right lower quadrant.  There is CVA tenderness. There is no rigidity, no rebound and no guarding.  Lymphadenopathy:    She has no cervical adenopathy.  Neurological: She is alert and oriented to person, place, and time.  Skin: Skin is warm and dry.  Nursing note and vitals reviewed.   ED Course  Procedures (including critical care time)  Labs Review Labs Reviewed  URINE CULTURE  POCT URINALYSIS DIP (DEVICE)  POCT PREGNANCY, URINE    Imaging Review No results found.   Visual Acuity Review  Right Eye Distance:   Left Eye Distance:   Bilateral Distance:    Right Eye Near:   Left Eye Near:    Bilateral Near:         MDM   1. Abdominal pain, acute, right lower quadrant    Sent for eval of rlq pain - poss appy v ov cyst., u/a upreg neg.    Linna HoffJames D Kabe Mckoy, MD 12/12/15 1452

## 2015-12-14 ENCOUNTER — Telehealth (HOSPITAL_COMMUNITY): Payer: Self-pay | Admitting: Emergency Medicine

## 2015-12-14 LAB — URINE CULTURE
Culture: >=100000 — AB
SPECIAL REQUESTS: NORMAL

## 2015-12-14 LAB — GC/CHLAMYDIA PROBE AMP (~~LOC~~) NOT AT ARMC
Chlamydia: NEGATIVE
NEISSERIA GONORRHEA: NEGATIVE

## 2015-12-14 MED ORDER — CEPHALEXIN 500 MG PO CAPS: 500.0000 mg | ORAL_CAPSULE | Freq: Four times a day (QID) | ORAL | Status: DC

## 2015-12-14 NOTE — ED Notes (Signed)
Urine culture positive for infection.  Keflex sent to pharmacy on file.  Charm RingsErin J Keishana Klinger, MD 12/14/15 [phone]

## 2015-12-15 ENCOUNTER — Telehealth (HOSPITAL_COMMUNITY): Payer: Self-pay | Admitting: Emergency Medicine

## 2015-12-15 NOTE — ED Notes (Signed)
LM on pt's VM [phone] Need to give lab results from recent visit on 6/3 Also let pt know labs can be obtained from MyChart  Per Dr. Dayton ScrapeMurray,  Notes Recorded by Charm RingsErin J Honig, MD on 12/14/2015 at 1:17 PM Please notify patient of positive urine culture. Rx for keflex sent to pharmacy on file. Take 4 times a day for 5 days. Follow up as needed.

## 2015-12-16 NOTE — ED Notes (Signed)
Called pt and notified of recent lab results from visit 6/3 Pt ID'd properly... Reports she's still having BA  Per Dr. Piedad ClimesHonig,  Notes Recorded by Charm RingsErin J Honig, MD on 12/14/2015 at 1:17 PM Please notify patient of positive urine culture. Rx for keflex sent to pharmacy on file. Take 4 times a day for 5 days. Follow up as needed.  Adv pt if sx are not getting better to return  Pt verb understanding

## 2016-01-08 ENCOUNTER — Ambulatory Visit (INDEPENDENT_AMBULATORY_CARE_PROVIDER_SITE_OTHER): Payer: Medicaid Other | Admitting: Obstetrics and Gynecology

## 2016-01-08 VITALS — BP 112/78 | HR 75 | Temp 98.2°F | Resp 18 | Wt 128.4 lb

## 2016-01-08 DIAGNOSIS — R102 Pelvic and perineal pain: Secondary | ICD-10-CM | POA: Diagnosis present

## 2016-01-08 DIAGNOSIS — Z789 Other specified health status: Secondary | ICD-10-CM

## 2016-01-08 DIAGNOSIS — Z87440 Personal history of urinary (tract) infections: Secondary | ICD-10-CM | POA: Diagnosis not present

## 2016-01-08 LAB — POCT URINALYSIS DIP (DEVICE)
BILIRUBIN URINE: NEGATIVE
GLUCOSE, UA: NEGATIVE mg/dL
HGB URINE DIPSTICK: NEGATIVE
Ketones, ur: NEGATIVE mg/dL
LEUKOCYTES UA: NEGATIVE
NITRITE: NEGATIVE
Protein, ur: NEGATIVE mg/dL
SPECIFIC GRAVITY, URINE: 1.01 (ref 1.005–1.030)
Urobilinogen, UA: 0.2 mg/dL (ref 0.0–1.0)
pH: 6.5 (ref 5.0–8.0)

## 2016-01-08 NOTE — Progress Notes (Signed)
Obstetrics and Gynecology Visit New Patient Evaluation  Appointment Date: 01/08/2016  Referring Provider: ER  Chief Complaint: follow up pelvic pain and RO cyst  History of Present Illness: Toni Hall is a 45 y.o. with the above CC. LMP 6/3. Patient went to the ER on 6/3 for abdominal pain fevers, chills, dysuria and was diagnosed with a "2.5 x 2.1 x 2.0 cm complex right ovarian lesion, favored to reflect a degenerating corpus luteal cyst." Her UCx came back + and she was put on keflex which she states she took. She denies any fevers, chills but states she still has occasional lower belly discomfort.   Review of Systems: as per HPI  Past Medical History:  Past Medical History  Diagnosis Date  . Carpal tunnel syndrome     Past Surgical History:  Past Surgical History  Procedure Laterality Date  . Hernia repair      Social History:  Social History   Social History  . Marital Status: Single    Spouse Name: N/A  . Number of Children: N/A  . Years of Education: N/A   Occupational History  . Not on file.   Social History Main Topics  . Smoking status: Never Smoker   . Smokeless tobacco: Not on file  . Alcohol Use: No  . Drug Use: No  . Sexual Activity: Not on file   Other Topics Concern  . Not on file   Social History Narrative    Medications Toni Hall does not currently have medications on file. Current Outpatient Prescriptions  Medication Sig Dispense Refill  . ibuprofen (ADVIL,MOTRIN) 800 MG tablet Take 1 tablet (800 mg total) by mouth 3 (three) times daily. 21 tablet 0  . ibuprofen (ADVIL,MOTRIN) 800 MG tablet Take 1 tablet (800 mg total) by mouth 3 (three) times daily. 21 tablet 0  . acetaminophen (TYLENOL) 500 MG tablet Take 1 tablet (500 mg total) by mouth every 6 (six) hours as needed. (Patient not taking: Reported on 01/08/2016) 30 tablet 0  . aspirin 325 MG EC tablet Take 325 mg by mouth daily as needed for pain. Reported on  01/08/2016    . cephALEXin (KEFLEX) 500 MG capsule Take 1 capsule (500 mg total) by mouth 4 (four) times daily. (Patient not taking: Reported on 01/08/2016) 20 capsule 0   No current facility-administered medications for this visit.    Allergies Review of patient's allergies indicates no known allergies.   Physical Exam:  BP 112/78 mmHg  Pulse 75  Temp(Src) 98.2 F (36.8 C) (Oral)  Resp 18  Wt 128 lb 6.4 oz (58.242 kg)  SpO2 100%  LMP 11/22/2015 (Approximate) Body mass index is 22.75 kg/(m^2). General appearance: Well nourished, well developed female in no acute distress.  No CVAT Abdomen: soft, nttp, nd  Assessment: ?lingering UTI  Plan: I told her that her s/s in the ER don't sound GYN in etiology and more like a bad UTI.  I told her that the abx she was on may not have completely taken care of her UTI given the mixed resistance to PCN-like abx and no keflex susceptibilities done. Will repeat Ucx and let her know results (new # [phone]). I told her if + then will retreat but if neg and pain persists patient told to follow up with her PCP, which she states [facility].  I told her that based on the cyst characteristics that she doesn't need to follow up for that.   RTC  PRN  Ricquel Foulk, Jr MD [facility](Faculty Practice)

## 2016-01-10 LAB — URINE CULTURE
Colony Count: NO GROWTH
Organism ID, Bacteria: NO GROWTH

## 2016-01-13 ENCOUNTER — Telehealth: Payer: Self-pay | Admitting: General Practice

## 2016-01-13 NOTE — Telephone Encounter (Signed)
Called patient with pac[place] interpreter [phone], no answer- left message stating we are calling to let you know your most recent urine culture was negative. If you have any questions or concerns you may call us back at the clinics.

## 2016-02-24 ENCOUNTER — Emergency Department (HOSPITAL_COMMUNITY)
Admission: EM | Admit: 2016-02-24 | Discharge: 2016-02-24 | Disposition: A | Discharge status: Home / Self Care | Payer: Medicaid Other | Attending: Emergency Medicine | Admitting: Emergency Medicine

## 2016-02-24 ENCOUNTER — Emergency Department (HOSPITAL_COMMUNITY): Payer: Medicaid Other

## 2016-02-24 DIAGNOSIS — Y9339 Activity, other involving climbing, rappelling and jumping off: Secondary | ICD-10-CM | POA: Diagnosis not present

## 2016-02-24 DIAGNOSIS — M25571 Pain in right ankle and joints of right foot: Secondary | ICD-10-CM | POA: Insufficient documentation

## 2016-02-24 DIAGNOSIS — Y929 Unspecified place or not applicable: Secondary | ICD-10-CM | POA: Insufficient documentation

## 2016-02-24 DIAGNOSIS — Y999 Unspecified external cause status: Secondary | ICD-10-CM | POA: Insufficient documentation

## 2016-02-24 DIAGNOSIS — X501XXA Overexertion from prolonged static or awkward postures, initial encounter: Secondary | ICD-10-CM | POA: Diagnosis not present

## 2016-02-24 DIAGNOSIS — Z7982 Long term (current) use of aspirin: Secondary | ICD-10-CM | POA: Insufficient documentation

## 2016-02-24 MED ORDER — OXYCODONE-ACETAMINOPHEN 5-325 MG PO TABS
1.0000 | ORAL_TABLET | Freq: Once | ORAL | Status: AC
  Administered 2016-02-24: 1 via ORAL
  Filled 2016-02-24: qty 1

## 2016-02-24 MED ORDER — OXYCODONE-ACETAMINOPHEN 5-325 MG PO TABS: 1.0000 | ORAL_TABLET | Freq: Four times a day (QID) | ORAL | 0 refills | Status: DC | PRN

## 2016-02-24 NOTE — ED Notes (Signed)
Patient is A&Ox4 at this time.  Patient in no signs of distress.  Please see providers note for complete history and physical exam.  

## 2016-02-24 NOTE — ED Triage Notes (Signed)
Pt reports hearing snapping sound when landing on her foot post jumping, pt reports feeling a hole in the achilles area, no swelling noted at this time, pt able to wiggle toes, pt c/o worsening pain with flexion of the ankle, A&O x4, +PMS

## 2016-02-24 NOTE — ED Notes (Signed)
Patient Alert and oriented X4. Stable and ambulatory. Patient verbalized understanding of the discharge instructions.  Patient belongings were taken by the patient.  

## 2016-02-24 NOTE — ED Provider Notes (Signed)
[facility]-EMER[facility]ENCY DEPT Provider Note   CSN: [id number] Arrival date & time: 02/24/16  [id number]  By signing my name below, I, Freida Busmaniana Omoyeni, attest that this documentation has been prepared under the direction and in the presence of non-physician practitioner, Cheri FowlerKayla Shaunte Weissinger, PA-C . Electronically Signed: Freida Busmaniana Omoyeni, Scribe. 02/24/2016. 9:05 PM.  History   Chief Complaint Chief Complaint  Patient presents with  . Ankle Pain    The history is provided by the patient and a relative. The history is limited by a language barrier. A language interpreter was used (Son).     HPI Comments:  Toni Hall is a 45 y.o. female who presents to the Emergency Department complaining of constant pain to the back of the right foot s/p injury today. She states she was jumping and landed on the foot improperly. Her pain is exacerbated by movement. Pt states she feels like there is a hole in the achilles region.  She denies numbness. Pt has no other complaints or symptoms at this time. Pt is not a native english speaker; history partially translated by son.   Past Medical History:  Diagnosis Date  . Carpal tunnel syndrome     There are no active problems to display for this patient.   Past Surgical History:  Procedure Laterality Date  . HERNIA REPAIR      OB History    No data available       Home Medications    Prior to Admission medications   Medication Sig Start Date End Date Taking? Authorizing Provider  acetaminophen (TYLENOL) 500 MG tablet Take 1 tablet (500 mg total) by mouth every 6 (six) hours as needed. Patient not taking: Reported on 01/08/2016 07/29/15   Rolm GalaStevi Barrett, PA-C  aspirin 325 MG EC tablet Take 325 mg by mouth daily as needed for pain. Reported on 01/08/2016    Historical Provider, MD  cephALEXin (KEFLEX) 500 MG capsule Take 1 capsule (500 mg total) by mouth 4 (four) times daily. Patient not taking: Reported on 01/08/2016 12/14/15   Charm RingsErin J Honig, MD  ibuprofen  (ADVIL,MOTRIN) 800 MG tablet Take 1 tablet (800 mg total) by mouth 3 (three) times daily. 07/29/15   Stevi Barrett, PA-C  ibuprofen (ADVIL,MOTRIN) 800 MG tablet Take 1 tablet (800 mg total) by mouth 3 (three) times daily. 12/12/15   Stevi Barrett, PA-C  oxyCODONE-acetaminophen (PERCOCET/ROXICET) 5-325 MG tablet Take 1 tablet by mouth every 6 (six) hours as needed for severe pain. 02/24/16   Cheri FowlerKayla Kaysey Berndt, PA-C    Family History No family history on file.  Social History Social History  Substance Use Topics  . Smoking status: Never Smoker  . Smokeless tobacco: Not on file  . Alcohol use No     Allergies   Review of patient's allergies indicates no known allergies.   Review of Systems Review of Systems  Musculoskeletal: Positive for arthralgias and myalgias.  Neurological: Negative for weakness and numbness.     Physical Exam Updated Vital Signs BP 122/83 (BP Location: Left Arm)   Pulse 77   Temp 98.2 F (36.8 C) (Oral)   Resp 18   LMP 02/16/2016 (Approximate)   SpO2 100%   Physical Exam  Constitutional: She is oriented to person, place, and time. She appears well-developed and well-nourished.  HENT:  Head: Normocephalic and atraumatic.  Right Ear: External ear normal.  Left Ear: External ear normal.  Eyes: Conjunctivae are normal. No scleral icterus.  Neck: No tracheal deviation present.  Cardiovascular:  Brisk cap refill  2+ DP pulses  Pulmonary/Chest: Effort normal. No respiratory distress.  Abdominal: She exhibits no distension.  Musculoskeletal:  right ankle: palpable gap at achilles tendon; positive thompson sign. Decreased dorsiflexion Remaining ankle nontender   Neurological: She is alert and oriented to person, place, and time.  Skin: Skin is warm and dry.  Psychiatric: She has a normal mood and affect. Her behavior is normal.  Nursing note and vitals reviewed.    ED Treatments / Results  DIAGNOSTIC STUDIES:  Oxygen Saturation is 100% on RA, normal by my  interpretation.    COORDINATION OF CARE:  7:48 PM Discussed treatment plan with pt at bedside and pt agreed to plan.  Labs (all labs ordered are listed, but only abnormal results are displayed) Labs Reviewed - No data to display  EKG  EKG Interpretation None       Radiology Dg Ankle Complete Right  Result Date: 02/24/2016 CLINICAL DATA:  45 year old female with jumping injury. Posterior right ankle pain. Initial encounter. EXAM: RIGHT ANKLE - COMPLETE 3+ VIEW COMPARISON:  None. FINDINGS: Mortise joint alignment preserved. Talar dome intact. Bone mineralization is within normal limits. Calcaneus intact. Distal tibia and fibula intact. No acute osseous abnormality identified. IMPRESSION: No acute fracture or dislocation identified about the right ankle. Electronically Signed   By: Odessa FlemingH  Hall M.D.   On: 02/24/2016 20:28    Procedures Procedures (including critical care time)  Medications Ordered in ED Medications  oxyCODONE-acetaminophen (PERCOCET/ROXICET) 5-325 MG per tablet 1 tablet (1 tablet Oral Given 02/24/16 1956)    8:13 PM Discussed case with Dr. Roda ShuttersXu (orthopedics). Patient will be placed in cam walker boot, given crutches, and sent home with pain medication for follow-up in his office in the next 2 days.  Initial Impression / Assessment and Plan / ED Course  I have reviewed the triage vital signs and the nursing notes.  Pertinent labs & imaging results that were available during my care of the patient were reviewed by me and considered in my medical decision making (see chart for details).  Clinical Course   Patient X-Ray negative for obvious fracture or dislocation.  However, have a high suspicion for Achilles tendon rupture. Spoke with Dr. Roda ShuttersXu, orthopedics. Patient will follow-up in his office. Patient given cam walker and crutches while in ED, conservative therapy recommended and discussed. Patient will be discharged home & is agreeable with above plan. Returns precautions  discussed. Pt appears safe for discharge.   Final Clinical Impressions(s) / ED Diagnoses   Final diagnoses:  Right ankle pain    New Prescriptions New Prescriptions   OXYCODONE-ACETAMINOPHEN (PERCOCET/ROXICET) 5-325 MG TABLET    Take 1 tablet by mouth every 6 (six) hours as needed for severe pain.   I personally performed the services described in this documentation, which was scribed in my presence. The recorded information has been reviewed and is accurate.     Cheri FowlerKayla Kyser Wandel, PA-C 02/24/16 2106    Lavera Guiseana Duo Liu, MD 02/25/16 [phone]

## 2016-02-24 NOTE — ED Notes (Signed)
Patient ambulated with crutches to the restroom

## 2016-03-14 ENCOUNTER — Encounter (HOSPITAL_COMMUNITY): Payer: Self-pay | Admitting: Vascular Surgery

## 2016-03-14 ENCOUNTER — Inpatient Hospital Stay (HOSPITAL_COMMUNITY)
Admission: EM | Admit: 2016-03-14 | Discharge: 2016-03-16 | DRG: 603 | Disposition: A | Discharge status: Home / Self Care | Payer: Medicaid Other | Attending: Orthopedic Surgery | Admitting: Orthopedic Surgery

## 2016-03-14 DIAGNOSIS — T8140XA Infection following a procedure, unspecified, initial encounter: Secondary | ICD-10-CM

## 2016-03-14 DIAGNOSIS — L03115 Cellulitis of right lower limb: Principal | ICD-10-CM | POA: Diagnosis present

## 2016-03-14 DIAGNOSIS — Z9889 Other specified postprocedural states: Secondary | ICD-10-CM

## 2016-03-14 DIAGNOSIS — Z79899 Other long term (current) drug therapy: Secondary | ICD-10-CM

## 2016-03-14 DIAGNOSIS — Z7982 Long term (current) use of aspirin: Secondary | ICD-10-CM

## 2016-03-14 LAB — CBC
HCT: 39.2 % (ref 36.0–46.0)
HEMOGLOBIN: 12.5 g/dL (ref 12.0–15.0)
MCH: 26.5 pg (ref 26.0–34.0)
MCHC: 31.9 g/dL (ref 30.0–36.0)
MCV: 83.1 fL (ref 78.0–100.0)
PLATELETS: 320 10*3/uL (ref 150–400)
RBC: 4.72 MIL/uL (ref 3.87–5.11)
RDW: 13.8 % (ref 11.5–15.5)
WBC: 9 10*3/uL (ref 4.0–10.5)

## 2016-03-14 LAB — COMPREHENSIVE METABOLIC PANEL
ALK PHOS: 56 U/L (ref 38–126)
ALT: 17 U/L (ref 14–54)
ANION GAP: 4 — AB (ref 5–15)
AST: 20 U/L (ref 15–41)
Albumin: 3.6 g/dL (ref 3.5–5.0)
BILIRUBIN TOTAL: 0.3 mg/dL (ref 0.3–1.2)
BUN: 9 mg/dL (ref 6–20)
CALCIUM: 8.8 mg/dL — AB (ref 8.9–10.3)
CO2: 28 mmol/L (ref 22–32)
CREATININE: 0.69 mg/dL (ref 0.44–1.00)
Chloride: 105 mmol/L (ref 101–111)
Glucose, Bld: 101 mg/dL — ABNORMAL HIGH (ref 65–99)
Potassium: 4.4 mmol/L (ref 3.5–5.1)
SODIUM: 137 mmol/L (ref 135–145)
TOTAL PROTEIN: 6.8 g/dL (ref 6.5–8.1)

## 2016-03-14 MED ORDER — ONDANSETRON 4 MG PO TBDP
4.0000 mg | ORAL_TABLET | Freq: Once | ORAL | Status: AC | PRN
  Administered 2016-03-14: 4 mg via ORAL

## 2016-03-14 MED ORDER — ONDANSETRON 4 MG PO TBDP
ORAL_TABLET | ORAL | Status: AC
  Filled 2016-03-14: qty 1

## 2016-03-14 NOTE — ED Triage Notes (Signed)
Also reports some nausea and vomiting.

## 2016-03-14 NOTE — ED Triage Notes (Addendum)
Pt reports to the ED for eval of right posterior foot pain. Pt had surgery to the area on 8/21 and reports that she was progressing well until 2 days ago when she developed increased pain, erythema, and swelling to the site. She has also had some sanguinous drainage. Reports she has chills but unsure if she has had a fever.

## 2016-03-15 ENCOUNTER — Encounter (HOSPITAL_COMMUNITY): Payer: Self-pay | Admitting: *Deleted

## 2016-03-15 ENCOUNTER — Emergency Department (HOSPITAL_COMMUNITY): Payer: Medicaid Other

## 2016-03-15 DIAGNOSIS — Z9889 Other specified postprocedural states: Secondary | ICD-10-CM | POA: Diagnosis not present

## 2016-03-15 DIAGNOSIS — T8140XA Infection following a procedure, unspecified, initial encounter: Secondary | ICD-10-CM

## 2016-03-15 DIAGNOSIS — L03115 Cellulitis of right lower limb: Secondary | ICD-10-CM | POA: Diagnosis present

## 2016-03-15 DIAGNOSIS — Z7982 Long term (current) use of aspirin: Secondary | ICD-10-CM | POA: Diagnosis not present

## 2016-03-15 DIAGNOSIS — G8918 Other acute postprocedural pain: Secondary | ICD-10-CM | POA: Diagnosis present

## 2016-03-15 DIAGNOSIS — Z79899 Other long term (current) drug therapy: Secondary | ICD-10-CM | POA: Diagnosis not present

## 2016-03-15 LAB — C-REACTIVE PROTEIN: CRP: 0.6 mg/dL (ref <1.0)

## 2016-03-15 LAB — SEDIMENTATION RATE: SED RATE: 10 mm/h (ref 0–22)

## 2016-03-15 MED ORDER — SODIUM CHLORIDE 0.9 % IV BOLUS (SEPSIS)
500.0000 mL | Freq: Once | INTRAVENOUS | Status: AC
  Administered 2016-03-15: 500 mL via INTRAVENOUS

## 2016-03-15 MED ORDER — ACETAMINOPHEN 325 MG PO TABS
650.0000 mg | ORAL_TABLET | Freq: Four times a day (QID) | ORAL | Status: DC | PRN
  Administered 2016-03-15 – 2016-03-16 (×2): 650 mg via ORAL
  Filled 2016-03-15 (×2): qty 2

## 2016-03-15 MED ORDER — VANCOMYCIN HCL IN DEXTROSE 1-5 GM/200ML-% IV SOLN
1000.0000 mg | Freq: Once | INTRAVENOUS | Status: AC
  Administered 2016-03-15: 1000 mg via INTRAVENOUS
  Filled 2016-03-15: qty 200

## 2016-03-15 MED ORDER — DIPHENHYDRAMINE HCL 12.5 MG/5ML PO ELIX
12.5000 mg | ORAL_SOLUTION | ORAL | Status: DC | PRN
  Administered 2016-03-16: 25 mg via ORAL
  Filled 2016-03-15: qty 10

## 2016-03-15 MED ORDER — HYDROCODONE-ACETAMINOPHEN 5-325 MG PO TABS
1.0000 | ORAL_TABLET | Freq: Once | ORAL | Status: AC
  Administered 2016-03-15: 1 via ORAL
  Filled 2016-03-15: qty 1

## 2016-03-15 MED ORDER — ACETAMINOPHEN 650 MG RE SUPP: 650.0000 mg | Freq: Four times a day (QID) | RECTAL | Status: DC | PRN

## 2016-03-15 MED ORDER — PIPERACILLIN-TAZOBACTAM 3.375 G IVPB
3.3750 g | Freq: Three times a day (TID) | INTRAVENOUS | Status: DC
  Administered 2016-03-15 – 2016-03-16 (×5): 3.375 g via INTRAVENOUS
  Filled 2016-03-15 (×7): qty 50

## 2016-03-15 MED ORDER — OXYCODONE HCL 5 MG PO TABS
5.0000 mg | ORAL_TABLET | ORAL | Status: DC | PRN
  Administered 2016-03-15: 10 mg via ORAL
  Filled 2016-03-15: qty 2

## 2016-03-15 MED ORDER — VANCOMYCIN HCL 10 G IV SOLR
1250.0000 mg | Freq: Two times a day (BID) | INTRAVENOUS | Status: DC
  Administered 2016-03-15 – 2016-03-16 (×3): 1250 mg via INTRAVENOUS
  Filled 2016-03-15 (×4): qty 1250

## 2016-03-15 MED ORDER — ONDANSETRON HCL 4 MG/2ML IJ SOLN
4.0000 mg | Freq: Four times a day (QID) | INTRAMUSCULAR | Status: DC | PRN
  Administered 2016-03-16: 4 mg via INTRAVENOUS
  Filled 2016-03-15: qty 2

## 2016-03-15 MED ORDER — SODIUM CHLORIDE 0.9 % IV SOLN
INTRAVENOUS | Status: DC
  Administered 2016-03-15: 03:00:00 via INTRAVENOUS

## 2016-03-15 MED ORDER — HYDROMORPHONE HCL 1 MG/ML IJ SOLN
1.0000 mg | INTRAMUSCULAR | Status: DC | PRN
  Administered 2016-03-15: 1 mg via INTRAVENOUS
  Filled 2016-03-15: qty 1

## 2016-03-15 NOTE — Progress Notes (Signed)
Patient ID: Toni Hall, female   DOB: 07/28/1970, 45 y.o.   MRN: [id number] The patient will be admitted for IV antibiotics and possible surgery to treat a likely post-operative infection following an achilles tendon repair.  Will inform the operative MD (Dr. August Saucerean) in the am.

## 2016-03-15 NOTE — H&P (Signed)
Toni Hall is an 45 y.o. female.   Chief Complaint:   Right ankle pain and redness post surgery HPI:   Two weeks post right achilles direct primary repair.  Presented to the ED late last night with pain, rednss, swelling, and questionable drainage from her incision.  Was admitted for IV antibiotics and assessment for possible I&D.    Past Medical History:  Diagnosis Date  . Carpal tunnel syndrome     Past Surgical History:  Procedure Laterality Date  . ACHILLES TENDON REPAIR    . HERNIA REPAIR      History reviewed. No pertinent family history. Social History:  reports that she has never smoked. She has never used smokeless tobacco. She reports that she does not drink alcohol or use drugs.  Allergies: No Known Allergies  Medications Prior to Admission  Medication Sig Dispense Refill  . aspirin 325 MG EC tablet Take 325 mg by mouth daily as needed for pain. Reported on 01/08/2016    . doxycycline (VIBRA-TABS) 100 MG tablet Take 100 mg by mouth 2 (two) times daily. For 14 days    . methocarbamol (ROBAXIN) 500 MG tablet Take 500 mg by mouth every 8 (eight) hours as needed for muscle spasms.    Marland Kitchen oxyCODONE-acetaminophen (PERCOCET/ROXICET) 5-325 MG tablet Take 1 tablet by mouth every 6 (six) hours as needed for severe pain. 16 tablet 0    Results for orders placed or performed during the hospital encounter of 03/14/16 (from the past 48 hour(s))  Comprehensive metabolic panel     Status: Abnormal   Collection Time: 03/14/16  9:36 PM  Result Value Ref Range   Sodium 137 135 - 145 mmol/L   Potassium 4.4 3.5 - 5.1 mmol/L   Chloride 105 101 - 111 mmol/L   CO2 28 22 - 32 mmol/L   Glucose, Bld 101 (H) 65 - 99 mg/dL   BUN 9 6 - 20 mg/dL   Creatinine, Ser 0.69 0.44 - 1.00 mg/dL   Calcium 8.8 (L) 8.9 - 10.3 mg/dL   Total Protein 6.8 6.5 - 8.1 g/dL   Albumin 3.6 3.5 - 5.0 g/dL   AST 20 15 - 41 U/L   ALT 17 14 - 54 U/L   Alkaline Phosphatase 56 38 - 126 U/L   Total Bilirubin  0.3 0.3 - 1.2 mg/dL   GFR calc non Af Amer >60 >60 mL/min   GFR calc Af Amer >60 >60 mL/min    Comment: (NOTE) The eGFR has been calculated using the CKD EPI equation. This calculation has not been validated in all clinical situations. eGFR's persistently <60 mL/min signify possible Chronic Kidney Disease.    Anion gap 4 (L) 5 - 15  CBC     Status: None   Collection Time: 03/14/16  9:36 PM  Result Value Ref Range   WBC 9.0 4.0 - 10.5 K/uL   RBC 4.72 3.87 - 5.11 MIL/uL   Hemoglobin 12.5 12.0 - 15.0 g/dL   HCT 39.2 36.0 - 46.0 %   MCV 83.1 78.0 - 100.0 fL   MCH 26.5 26.0 - 34.0 pg   MCHC 31.9 30.0 - 36.0 g/dL   RDW 13.8 11.5 - 15.5 %   Platelets 320 150 - 400 K/uL  Sedimentation rate     Status: None   Collection Time: 03/15/16  4:55 AM  Result Value Ref Range   Sed Rate 10 0 - 22 mm/hr  C-reactive protein     Status: None   Collection  Time: 03/15/16  4:55 AM  Result Value Ref Range   CRP 0.6 <1.0 mg/dL   Dg Ankle Complete Right  Result Date: 03/15/2016 CLINICAL DATA:  Acute onset of right posterior foot pain. Recent Achilles tendon repair. Initial encounter. EXAM: RIGHT ANKLE - COMPLETE 3+ VIEW COMPARISON:  Right ankle radiographs performed 02/24/2016 FINDINGS: There is no evidence of fracture or dislocation. The ankle mortise is intact; the interosseous space is within normal limits. No talar tilt or subluxation is seen. The joint spaces are preserved. Mild soft tissue swelling is noted about the ankle. IMPRESSION: No evidence of fracture or dislocation. Electronically Signed   By: Garald Balding M.D.   On: 03/15/2016 01:26    Review of Systems  Constitutional: Positive for chills.  All other systems reviewed and are negative.   Blood pressure (!) 101/56, pulse 66, temperature 98 F (36.7 C), temperature source Oral, resp. rate 16, height 5' 1.81" (1.57 m), weight 63 kg (138 lb 14.2 oz), last menstrual period 02/16/2016, SpO2 99 %. Physical Exam  Constitutional: She is  oriented to person, place, and time. She appears well-developed and well-nourished.  HENT:  Head: Normocephalic and atraumatic.  Eyes: EOM are normal. Pupils are equal, round, and reactive to light.  Neck: Normal range of motion. Neck supple.  Cardiovascular: Normal rate and regular rhythm.   Respiratory: Effort normal and breath sounds normal.  GI: Soft. Bowel sounds are normal.  Musculoskeletal:       Right ankle: Tenderness.  Neurological: She is alert and oriented to person, place, and time.  Skin: Skin is warm and dry.  Psychiatric: She has a normal mood and affect.     Assessment/Plan Right posterior ankle incision with questionable infection 2 weeks post Achilles tendon repair. 1)  I had her started on Vanc and Zosyn.  The redness looks like a reaction to her sutures and could be a cellulitis as well.  I will keep her NPO for now and have Dr. Marlou Sa see her.  Mcarthur Rossetti, MD 03/15/2016, 6:50 AM

## 2016-03-15 NOTE — Progress Notes (Signed)
Patient sutures removed on right archilles as per order.  Minimal drainage noted. Patient medicated during procedure prn pain, tolerated well. Resting in bed, alert and verbal. Family at bedside.

## 2016-03-15 NOTE — H&P (Signed)
  The patient is being admitted for a post-operative infection involving her right achilles/ankle area post a direct primary repair of a ruptured achilles.  Full H&P to follow.

## 2016-03-15 NOTE — Progress Notes (Signed)
Interpreter Graciela Namihira for Susan care management. °

## 2016-03-15 NOTE — Progress Notes (Signed)
No fluctuance mild erythema at incision - neg homans - plan suture removal - keep today on iv abx recheck tomorrow - ok to eat

## 2016-03-15 NOTE — Progress Notes (Signed)
Pharmacy Antibiotic Note  Toni Hall is a 45 y.o. female admitted on 03/14/2016 with post-operative infection is/p repair of a ruptured achilles..  Pharmacy has been consulted for Vancomycin and Zosyn dosing.  Vancomycin 1 g IV given in ED at  0215  Plan: Vancomycin 1250 mg IV q12h Zosyn 3.375 g IV q8h   Height: 5' 1.81" (157 cm) Weight: 138 lb 14.2 oz (63 kg) IBW/kg (Calculated) : 49.67  Temp (24hrs), Avg:98.2 F (36.8 C), Min:98.1 F (36.7 C), Max:98.3 F (36.8 C)   Recent Labs Lab 03/14/16 2136  WBC 9.0  CREATININE 0.69    Estimated Creatinine Clearance: 76.3 mL/min (by C-G formula based on SCr of 0.8 mg/dL).    No Known Allergies   Toni Hall, Toni Hall 03/15/2016 2:47 AM

## 2016-03-15 NOTE — ED Provider Notes (Signed)
[facility]PT Provider Note   CSN: [id number] Arrival date & time: 03/14/16  2125     History   Chief Complaint Chief Complaint  Patient presents with  . Post-op Problem    HPI Toni Hall is a 45 y.o. female.  HPI   Patient is a 45 year old female presents to emergency Department complaining of postop pain, swelling and redness. She is status post right Achilles tendon repair on 8/21 (15 days ago).  She was healing well until 2 days ago when she began having pain, swelling, and redness around her incision with drainage described as clear, bloody and yellow.  She called the orthopedic office and they prescribed her doxycycline, filled and initiated yesterday.  Her pain and swelling continued to worsen acutely and she was unable to tolerate the boot because of swelling and worsening pain.  She took the brace off today and came to the ER.  Pain is rated 7/10, worse with any movement or palpation, no radiation.  She has not taken any pain medication or ibuprofen.  Her stiches feel tight, but they are intact, no dehiscence.  No fevers, but chills, N and V.     Past Medical History:  Diagnosis Date  . Carpal tunnel syndrome     Patient Active Problem List   Diagnosis Date Noted  . Post-operative infection right achilles 03/15/2016    Past Surgical History:  Procedure Laterality Date  . ACHILLES TENDON REPAIR    . HERNIA REPAIR      OB History    No data available       Home Medications    Prior to Admission medications   Medication Sig Start Date End Date Taking? Authorizing Provider  aspirin 325 MG EC tablet Take 325 mg by mouth daily as needed for pain. Reported on 01/08/2016   Yes Historical Provider, MD  doxycycline (VIBRA-TABS) 100 MG tablet Take 100 mg by mouth 2 (two) times daily. For 14 days   Yes Historical Provider, MD  methocarbamol (ROBAXIN) 500 MG tablet Take 500 mg by mouth every 8 (eight) hours as needed for muscle spasms.   Yes  Historical Provider, MD  oxyCODONE-acetaminophen (PERCOCET/ROXICET) 5-325 MG tablet Take 1 tablet by mouth every 6 (six) hours as needed for severe pain. 02/24/16  Yes Cheri Fowler, PA-C    Family History No family history on file.  Social History Social History  Substance Use Topics  . Smoking status: Never Smoker  . Smokeless tobacco: Never Used  . Alcohol use No     Allergies   Review of patient's allergies indicates no known allergies.   Review of Systems Review of Systems  All other systems reviewed and are negative.    Physical Exam Updated Vital Signs BP 122/74   Pulse 67   Temp 98.3 F (36.8 C) (Oral)   Resp 16   LMP 02/16/2016 (Approximate)   SpO2 100%   Physical Exam  Constitutional: She is oriented to person, place, and time. She appears well-developed and well-nourished. No distress.  HENT:  Head: Normocephalic and atraumatic.  Right Ear: External ear normal.  Left Ear: External ear normal.  Nose: Nose normal.  Mouth/Throat: Oropharynx is clear and moist.  Eyes: Conjunctivae are normal. Pupils are equal, round, and reactive to light. Right eye exhibits no discharge. Left eye exhibits no discharge. No scleral icterus.  Neck: Normal range of motion. Neck supple.  Cardiovascular: Normal rate and regular rhythm.   Pulmonary/Chest: Effort normal and breath sounds normal. No stridor.  No respiratory distress.  Musculoskeletal: Normal range of motion. She exhibits edema and tenderness.       Right lower leg: She exhibits tenderness and swelling.  Surgical incision to right heel with surrounding erythema, edema and diffuse ttp.  No active drainage, sutures intact.  Neurological: She is alert and oriented to person, place, and time. She exhibits normal muscle tone. Coordination normal.  Skin: Skin is warm and dry. Capillary refill takes less than 2 seconds. No rash noted. She is not diaphoretic. There is erythema. No pallor.  Psychiatric: She has a normal mood and  affect. Her behavior is normal. Judgment and thought content normal.  Nursing note and vitals reviewed.    ED Treatments / Results  Labs (all labs ordered are listed, but only abnormal results are displayed) Labs Reviewed  COMPREHENSIVE METABOLIC PANEL - Abnormal; Notable for the following:       Result Value   Glucose, Bld 101 (*)    Calcium 8.8 (*)    Anion gap 4 (*)    All other components within normal limits  CBC    EKG  EKG Interpretation None       Radiology Dg Ankle Complete Right  Result Date: 03/15/2016 CLINICAL DATA:  Acute onset of right posterior foot pain. Recent Achilles tendon repair. Initial encounter. EXAM: RIGHT ANKLE - COMPLETE 3+ VIEW COMPARISON:  Right ankle radiographs performed 02/24/2016 FINDINGS: There is no evidence of fracture or dislocation. The ankle mortise is intact; the interosseous space is within normal limits. No talar tilt or subluxation is seen. The joint spaces are preserved. Mild soft tissue swelling is noted about the ankle. IMPRESSION: No evidence of fracture or dislocation. Electronically Signed   By: Roanna RaiderJeffery  Chang M.D.   On: 03/15/2016 01:26    Procedures Procedures (including critical care time)  Medications Ordered in ED Medications  ondansetron (ZOFRAN-ODT) 4 MG disintegrating tablet (not administered)  vancomycin (VANCOCIN) IVPB 1000 mg/200 mL premix (not administered)  ondansetron (ZOFRAN-ODT) disintegrating tablet 4 mg (4 mg Oral Given 03/14/16 2136)  sodium chloride 0.9 % bolus 500 mL (500 mLs Intravenous New Bag/Given 03/15/16 0045)  HYDROcodone-acetaminophen (NORCO/VICODIN) 5-325 MG per tablet 1 tablet (1 tablet Oral Given 03/15/16 0045)     Initial Impression / Assessment and Plan / ED Course  I have reviewed the triage vital signs and the nursing notes.  Pertinent labs & imaging results that were available during my care of the patient were reviewed by me and considered in my medical decision making (see chart for  details).  Clinical Course   Pt with post op pain, redness, swelling x 2 days.  S/P achillies tendon repair 15 days ago.  Started doxycycline yesterday, but pain and swelling rapidly worsening.  Came to the ER for eval.  [facility] consulted, will admit for IV abx. Seen in a shared visit with EDP Dr. Mora Bellmanni.  Vanc ordered. Pt's labwork unremarkable, no leukocytosis.  Xray shows soft tissue swelling, no subq air.  Final Clinical Impressions(s) / ED Diagnoses   Final diagnoses:  Post op infection    New Prescriptions New Prescriptions   No medications on file     Danelle BerryLeisa Ladarien Beeks, PA-C 03/15/16 0154    Tomasita CrumbleAdeleke Oni, MD 03/15/16 [id number]

## 2016-03-15 NOTE — Progress Notes (Signed)
Patient ID: Toni Hall, female   DOB: [birth year], 45 y.o.   MRN: [id number] Vitals and Labs are all normal - normal WBC, CR-P, and ESR.

## 2016-03-16 MED ORDER — OXYCODONE-ACETAMINOPHEN 5-325 MG PO TABS
1.0000 | ORAL_TABLET | Freq: Four times a day (QID) | ORAL | 0 refills | Status: DC | PRN
Start: 2016-03-16 — End: 2021-04-22

## 2016-03-16 NOTE — Progress Notes (Signed)
Pt ready for discharge. Education/instructions reviewed with pt and her son, and all questions/concerns addressed. IV removed and belongings gathered. Pt will be transported out via wheelchair to son's vehicle. Will continue to monitor

## 2016-03-16 NOTE — Progress Notes (Signed)
Interpreter Ashby DawesGraciela for [employer] CompaniesCynthia PT and Maralyn SagoSarah CSW

## 2016-03-16 NOTE — Progress Notes (Signed)
Chaplain presented to the patient who speaks limited english, as does Chaplain speak limited spanish.  We managed communication enough to know she will be discharged this evening. She reports her employment options will be limited as a result of her leg injury, as well as other factors. Chaplain inquired of what support system she has, which is family and friends. Chaplain offered words of encouragement. Chaplain Janell QuietAudrey Moe Brier [phone]

## 2016-03-16 NOTE — Progress Notes (Signed)
PT Cancellation Note  Patient Details Name: Toni Hall MRN: [id number] DOB: 1971/05/27   Cancelled Treatment:    Reason Eval/Treat Not Completed: Other (comment); patient reports she has two boots at home.  Son will bring one in, but he doesn't get out of school till later in the day.  Will attempt later today.   Elray McgregorCynthia Hall 03/16/2016, 10:37 AM  Toni Hall, PT [phone] 03/16/2016

## 2016-03-16 NOTE — Evaluation (Signed)
Physical Therapy Evaluation Patient Details Name: Toni Hall MRN: [id number] DOB: 05-29-1971 Today's Date: 03/16/2016   History of Present Illness  Toni Hall is a 45 y.o. female admitted on 03/14/2016 with post-operative infection is/p repair of a ruptured achilles..    Clinical Impression  Patient presents s/p R Achilles repair 8/21 and now with admission due to infection.  Encouraged rest with foot elevated and after instruction and demonstration was able to perform PWB with crutches in camboot successfully.  She wanted to know exercises she could do.  I educated her to wait to follow up with MD on 9/11 for complete instructions on her protocol following repair.  May benefit from HHPT once able to perform exercises.  No further acute PT needs at this time.     Follow Up Recommendations No PT follow up    Equipment Recommendations  None recommended by PT    Recommendations for Other Services       Precautions / Restrictions Precautions Precautions: Fall Required Braces or Orthoses: Other Brace/Splint Other Brace/Splint: camboot with heel lift Restrictions RLE Weight Bearing: Partial weight bearing RLE Partial Weight Bearing Percentage or Pounds: per MD noted and RN report now PWB in camboot with heel lift      Mobility  Bed Mobility Overal bed mobility: Modified Independent                Transfers Overall transfer level: Modified independent Equipment used: None;Crutches             General transfer comment: used crutches at times to stand, and sometimes nothing, but kept NWB to [facility]  Ambulation/Gait Ambulation/Gait assistance: Supervision Ambulation Distance (Feet): 100 Feet Assistive device: Crutches Gait Pattern/deviations: Step-to pattern;Step-through pattern     General Gait Details: cues and demonstration and explanation for PWB with camboot, patient initially NWB, then Toe touch, then demonstrated appropriate technique walking back  and forth in the room  Stairs            Wheelchair Mobility    Modified Rankin (Stroke Patients Only)       Balance Overall balance assessment: No apparent balance deficits (not formally assessed)                                           Pertinent Vitals/Pain Pain Assessment: Faces Faces Pain Scale: Hurts a little bit Pain Location: R foot with movement Pain Descriptors / Indicators: Sore Pain Intervention(s): Monitored during session    Home Living Family/patient expects to be discharged to:: Private residence Living Arrangements: Children Available Help at Discharge: Family;Available PRN/intermittently Type of Home: Apartment Home Access: Elevator     Home Layout: One level Home Equipment: Crutches      Prior Function Level of Independence: Independent with assistive device(s)               Hand Dominance        Extremity/Trunk Assessment   Upper Extremity Assessment: Overall WFL for tasks assessed           Lower Extremity Assessment: RLE deficits/detail RLE Deficits / Details: limited ankle AROM due to recent Achilles repair, otherwise [facility]       Communication   Communication: Prefers language other than English;Interpreter utilized (Spanish speaking, Radio broadcast assistant interpreting)  Cognition Arousal/Alertness: Awake/alert Behavior During Therapy: WFL for tasks assessed/performed Overall Cognitive Status: Within Functional Limits for tasks assessed  General Comments      Exercises        Assessment/Plan    PT Assessment Patent does not need any further PT services  PT Diagnosis Difficulty walking   PT Problem List    PT Treatment Interventions     PT Goals (Current goals can be found in the Care Plan section) Acute Rehab PT Goals PT Goal Formulation: All assessment and education complete, DC therapy    Frequency     Barriers to discharge        Co-evaluation                End of Session Equipment Utilized During Treatment: Gait belt Activity Tolerance: Patient tolerated treatment well Patient left: in bed;with call bell/phone within reach;with family/visitor present Nurse Communication: Mobility status         Time: 1410-1440 PT Time Calculation (min) (ACUTE ONLY): 30 min   Charges:   PT Evaluation $PT Eval Moderate Complexity: 1 Procedure PT Treatments $Gait Training: 8-22 mins   PT G CodesElray Mcgregor:        Cynthia Wynn 03/16/2016, 3:44 PM  Sheran Lawlessyndi Wynn, PT [phone] 03/16/2016

## 2016-03-16 NOTE — Progress Notes (Signed)
Patient stable Incision looks good no drainage Erythema decreased Plan at this time is to discharge later this afternoon after next dose of IV antibiotics.  Discharge home on oral antibiotics.  Needs to be partial weightbearing with crutches and a fracture boot with 1 inch heel left.  Within the fracture boot  We'll discharge home today.  Needs dry dressing.  Okay to shower.  Follow up with me in 1 week

## 2016-03-21 LAB — CULTURE, BLOOD (ROUTINE X 2)
CULTURE: NO GROWTH
Culture: NO GROWTH

## 2016-03-31 NOTE — Discharge Summary (Signed)
Physician Discharge Summary  Patient ID: Toni Hall MRN: [id number] DOB/AGE: 46/25/1972 46 y.o.  Admit date: 03/14/2016 Discharge date: 03/16/2016  Admission Diagnoses:  Principal Problem:   Post-operative infection right achilles   Discharge Diagnoses:  Same  Surgeries:  on    Consultants:   Discharged Condition: Stable  Hospital Course: Toni Hall is an 45 y.o. female who was admitted 03/14/2016 with a chief complaint of  Chief Complaint  Patient presents with  . Post-op Problem  , and found to have a diagnosis of Postop cellulitis.  Patient never had drainage from the incision.  She did well with IV antibiotics.  She did not require any surgical intervention.  Unclear at this time if she even had a postop infection however she is improved on IV antibiotics.  She is discharged home in good condition on oral antibiotics and will follow up with me in 7 days for clinical recheck on the incision Antibiotics given:  Anti-infectives    Start     Dose/Rate Route Frequency Ordered Stop   03/15/16 1000  vancomycin (VANCOCIN) 1,250 mg in sodium chloride 0.9 % 250 mL IVPB  Status:  Discontinued     1,250 mg 166.7 mL/hr over 90 Minutes Intravenous Every 12 hours 03/15/16 0249 03/16/16 2038   03/15/16 0400  piperacillin-tazobactam (ZOSYN) IVPB 3.375 g  Status:  Discontinued     3.375 g 12.5 mL/hr over 240 Minutes Intravenous Every 8 hours 03/15/16 0249 03/16/16 2038   03/15/16 0100  vancomycin (VANCOCIN) IVPB 1000 mg/200 mL premix     1,000 mg 200 mL/hr over 60 Minutes Intravenous  Once 03/15/16 0059 03/15/16 0312    .  Recent vital signs:  Vitals:   03/15/16 2340 03/16/16 0515  BP: 99/87 (!) 101/57  Pulse: 69 70  Resp: 18 16  Temp: 98.2 F (36.8 C) 98.8 F (37.1 C)    Recent laboratory studies:  Results for orders placed or performed during the hospital encounter of 03/14/16  Culture, blood (Routine X 2) w Reflex to ID Panel  Result Value Ref  Range   Specimen Description BLOOD RIGHT HAND    Special Requests IN PEDIATRIC BOTTLE 3CC    Culture NO GROWTH 5 DAYS    Report Status 03/21/2016 FINAL   Culture, blood (Routine X 2) w Reflex to ID Panel  Result Value Ref Range   Specimen Description BLOOD LEFT HAND    Special Requests IN PEDIATRIC BOTTLE 3CC    Culture NO GROWTH 5 DAYS    Report Status 03/21/2016 FINAL   Comprehensive metabolic panel  Result Value Ref Range   Sodium 137 135 - 145 mmol/L   Potassium 4.4 3.5 - 5.1 mmol/L   Chloride 105 101 - 111 mmol/L   CO2 28 22 - 32 mmol/L   Glucose, Bld 101 (H) 65 - 99 mg/dL   BUN 9 6 - 20 mg/dL   Creatinine, Ser 4.09 0.44 - 1.00 mg/dL   Calcium 8.8 (L) 8.9 - 10.3 mg/dL   Total Protein 6.8 6.5 - 8.1 g/dL   Albumin 3.6 3.5 - 5.0 g/dL   AST 20 15 - 41 U/L   ALT 17 14 - 54 U/L   Alkaline Phosphatase 56 38 - 126 U/L   Total Bilirubin 0.3 0.3 - 1.2 mg/dL   GFR calc non Af Amer >60 >60 mL/min   GFR calc Af Amer >60 >60 mL/min   Anion gap 4 (L) 5 - 15  CBC  Result Value Ref Range  WBC 9.0 4.0 - 10.5 K/uL   RBC 4.72 3.87 - 5.11 MIL/uL   Hemoglobin 12.5 12.0 - 15.0 g/dL   HCT 11.939.2 14.736.0 - 82.946.0 %   MCV 83.1 78.0 - 100.0 fL   MCH 26.5 26.0 - 34.0 pg   MCHC 31.9 30.0 - 36.0 g/dL   RDW 56.213.8 13.011.5 - 86.515.5 %   Platelets 320 150 - 400 K/uL  Sedimentation rate  Result Value Ref Range   Sed Rate 10 0 - 22 mm/hr  C-reactive protein  Result Value Ref Range   CRP 0.6 <1.0 mg/dL    Discharge Medications:     Medication List    TAKE these medications   aspirin 325 MG EC tablet Take 325 mg by mouth daily as needed for pain. Reported on 01/08/2016   doxycycline 100 MG tablet Commonly known as:  VIBRA-TABS Take 100 mg by mouth 2 (two) times daily. For 14 days   methocarbamol 500 MG tablet Commonly known as:  ROBAXIN Take 500 mg by mouth every 8 (eight) hours as needed for muscle spasms.   oxyCODONE-acetaminophen 5-325 MG tablet Commonly known as:  PERCOCET/ROXICET Take 1  tablet by mouth every 6 (six) hours as needed for severe pain.       Diagnostic Studies: Dg Ankle Complete Right  Result Date: 03/15/2016 CLINICAL DATA:  Acute onset of right posterior foot pain. Recent Achilles tendon repair. Initial encounter. EXAM: RIGHT ANKLE - COMPLETE 3+ VIEW COMPARISON:  Right ankle radiographs performed 02/24/2016 FINDINGS: There is no evidence of fracture or dislocation. The ankle mortise is intact; the interosseous space is within normal limits. No talar tilt or subluxation is seen. The joint spaces are preserved. Mild soft tissue swelling is noted about the ankle. IMPRESSION: No evidence of fracture or dislocation. Electronically Signed   By: Roanna RaiderJeffery  Chang M.D.   On: 03/15/2016 01:26    Disposition: 01-Home or Self Care  Discharge Instructions    Call MD / Call 911    Complete by:  As directed    If you experience chest pain or shortness of breath, CALL 911 and be transported to the hospital emergency room.  If you develope a fever above 101 F, pus (white drainage) or increased drainage or redness at the wound, or calf pain, call your surgeon's office.   Constipation Prevention    Complete by:  As directed    Drink plenty of fluids.  Prune juice may be helpful.  You may use a stool softener, such as Colace (over the counter) 100 mg twice a day.  Use MiraLax (over the counter) for constipation as needed.   Diet - low sodium heart healthy    Complete by:  As directed    Discharge instructions    Complete by:  As directed    Resume oral antibiotics 1 by mouth twice a day Okay to be partial weightbearing 50% body weight using crutches and fracture boot that has one and she'll left Okay for ankle range of motion exercises Do not fully weight-bear on the affected ankle Okay to shower Follow-up with me in 1 week   Increase activity slowly as tolerated    Complete by:  As directed          Signed: Janann Boeve SCOTT 03/31/2016, 1:04 PM

## 2016-05-05 ENCOUNTER — Ambulatory Visit (INDEPENDENT_AMBULATORY_CARE_PROVIDER_SITE_OTHER): Payer: Self-pay | Admitting: Orthopedic Surgery

## 2016-05-19 ENCOUNTER — Ambulatory Visit (INDEPENDENT_AMBULATORY_CARE_PROVIDER_SITE_OTHER): Payer: Medicaid Other | Admitting: Orthopedic Surgery

## 2016-05-19 ENCOUNTER — Encounter (INDEPENDENT_AMBULATORY_CARE_PROVIDER_SITE_OTHER): Payer: Self-pay | Admitting: Orthopedic Surgery

## 2016-05-19 DIAGNOSIS — S86011D Strain of right Achilles tendon, subsequent encounter: Secondary | ICD-10-CM

## 2016-05-19 MED ORDER — IBUPROFEN 800 MG PO TABS: 800.0000 mg | ORAL_TABLET | Freq: Two times a day (BID) | ORAL | 0 refills | Status: DC

## 2016-05-19 MED ORDER — OXYCODONE HCL 5 MG PO CAPS: 5.0000 mg | ORAL_CAPSULE | ORAL | 0 refills | Status: DC | PRN

## 2016-05-19 NOTE — Progress Notes (Signed)
   Post-Op Visit Note   Patient: Toni Hall           Date of Birth: March 22, 1971           MRN: [id number] Visit Date: 05/19/2016 PCP: No PCP Per Patient   Assessment & Plan:  Chief Complaint: No chief complaint on file.  Visit Diagnoses: No diagnosis found.  Plan: Patient is a 45 year old female doing well 2 and half months out right Achilles tendon rupture repair starting using regular shoes she reports increased strength she wants go back to work   on exam she has very good plantar flexion strength and range of motion of the right ankle incision is intact    plan heel lift in the regular shoe half inch  #2 refill oxycodone and ibuprofen    No. 3 send her to physical therapy one time to 2 times a week for 3-4 weeks #4  okay to return to work 05/20/2016 follow-up with me as needed  Follow-Up Instructions: No Follow-up on file.   Orders:  No orders of the defined types were placed in this encounter.  No orders of the defined types were placed in this encounter.    PMFS History: Patient Active Problem List   Diagnosis Date Noted  . Post-operative infection right achilles 03/15/2016   Past Medical History:  Diagnosis Date  . Carpal tunnel syndrome     No family history on file.  Past Surgical History:  Procedure Laterality Date  . ACHILLES TENDON REPAIR    . HERNIA REPAIR     Social History   Occupational History  . Not on file.   Social History Main Topics  . Smoking status: Never Smoker  . Smokeless tobacco: Never Used  . Alcohol use No  . Drug use: No  . Sexual activity: Not on file

## 2016-10-24 ENCOUNTER — Telehealth (INDEPENDENT_AMBULATORY_CARE_PROVIDER_SITE_OTHER): Payer: Self-pay | Admitting: Orthopedic Surgery

## 2016-10-24 NOTE — Telephone Encounter (Signed)
Toni Hall @ [facility] CALLED, NEEDING RECORDS S/P 02/29/2016 SURGERY AS PATIENT DEVELOPED INFECTION AND SHE HAS TO HAVE ALL RECORDS FOR THE REPORT. I FAXED RECORDS TO HER [phone].

## 2017-09-22 IMAGING — US US TRANSVAGINAL NON-OB
1 series · 13 of 25 positions shown · non-contrast
Comparison: Prior CT from 03/18/2015

CLINICAL DATA: Initial valuation for acute right lower quadrant,
right lower back pain with chills.



[Series 1: us transvaginal non-ob · 0.21mm/px · 13 of 55 slices shown]
[im 1/55]
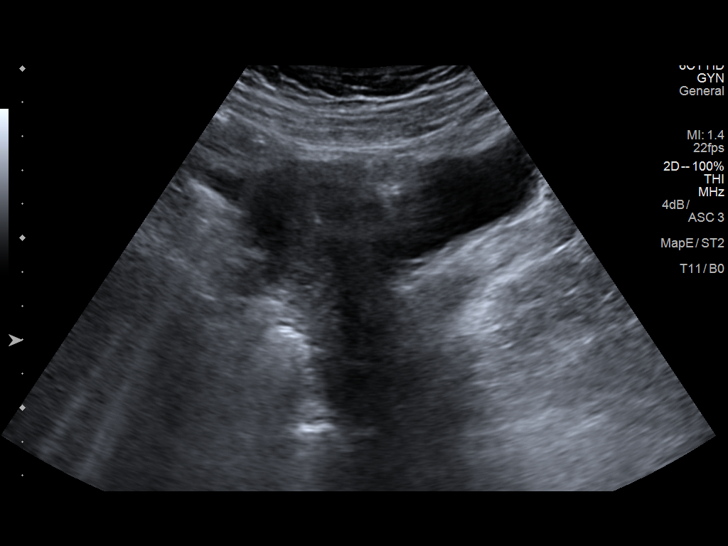
[im 5/55]
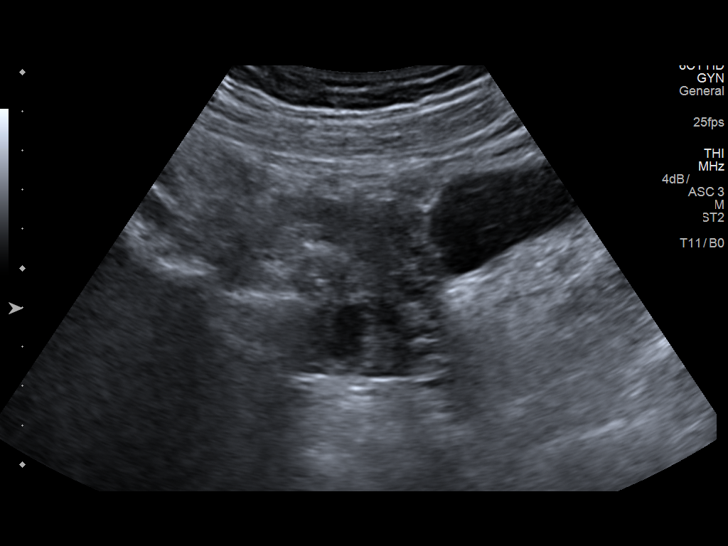
[im 10/55]
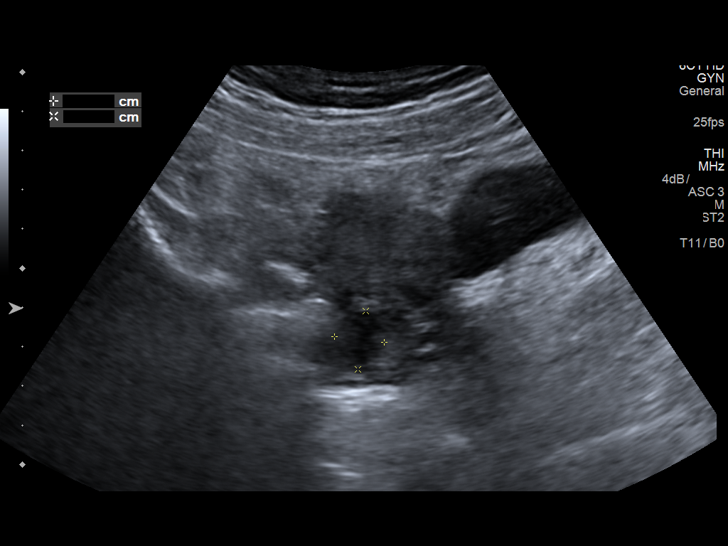
[im 14/55]
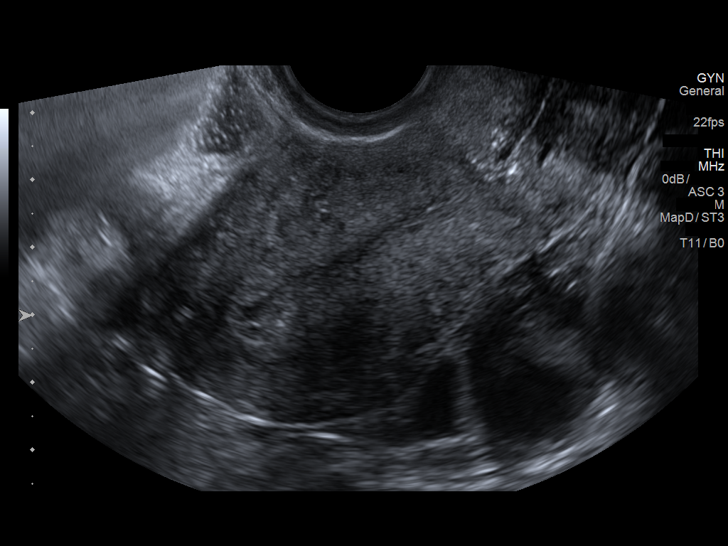
[im 19/55]
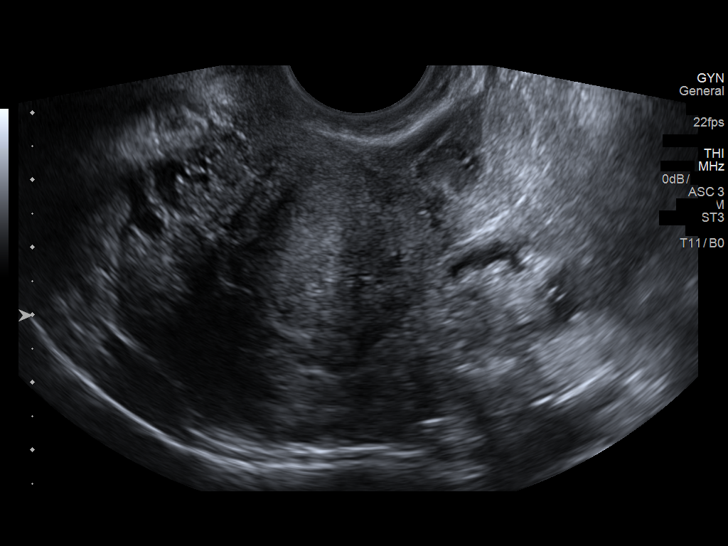
[im 23/55]
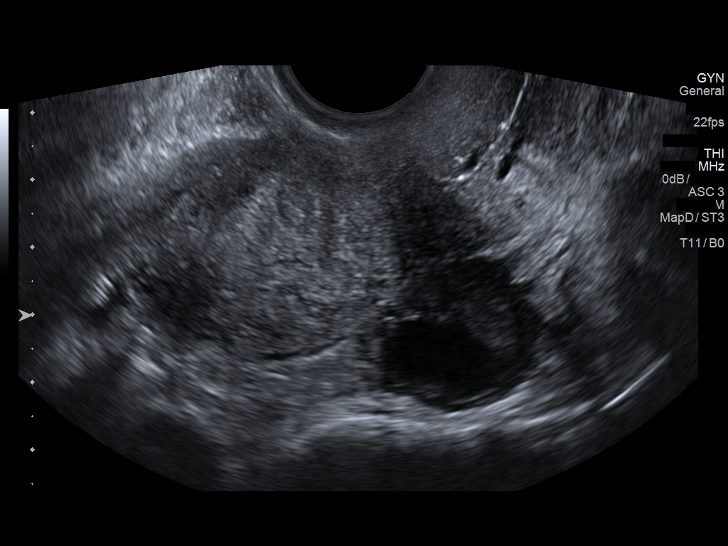
[im 28/55]
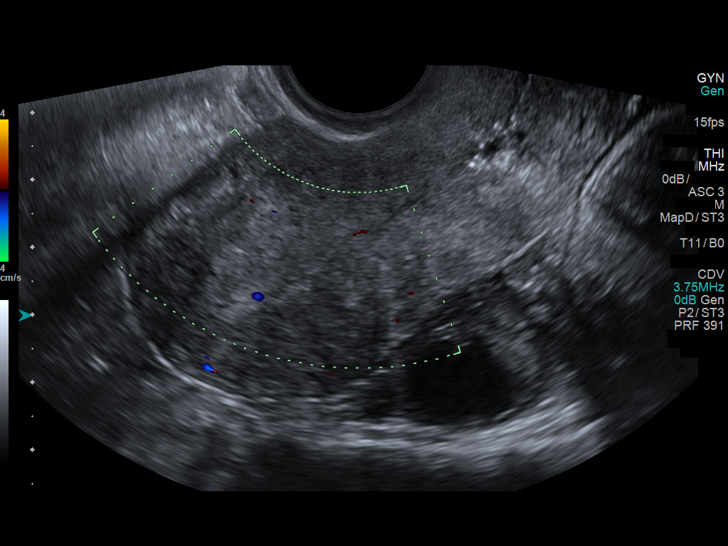
[im 32/55]
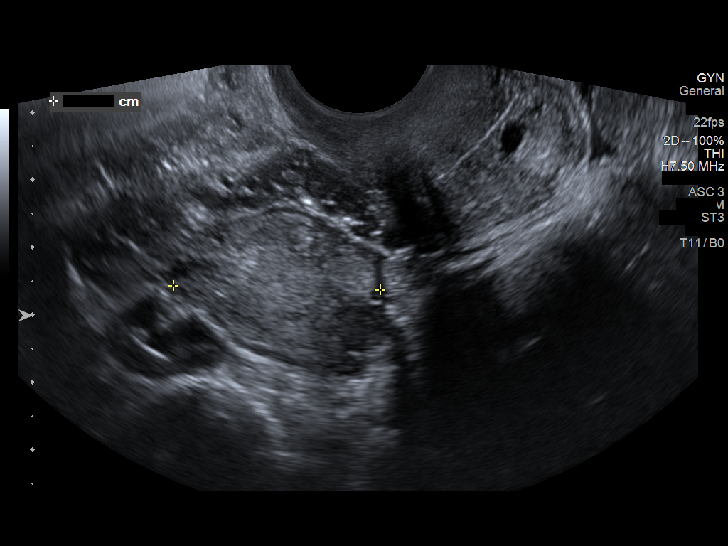
[im 37/55]
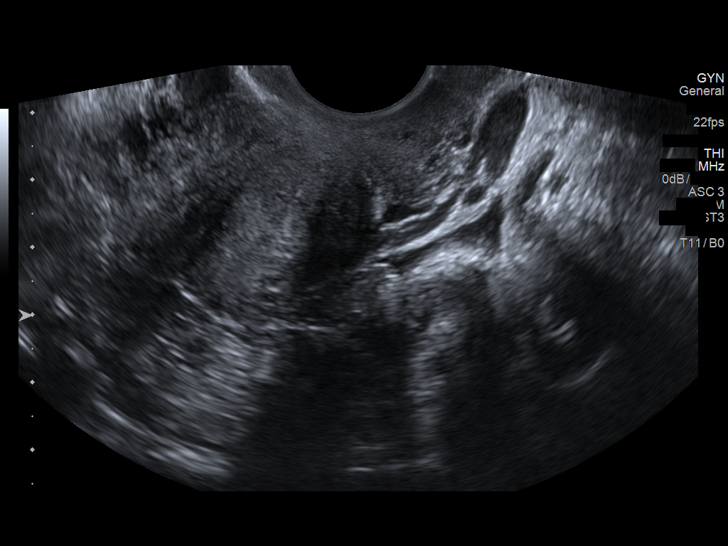
[im 41/55]
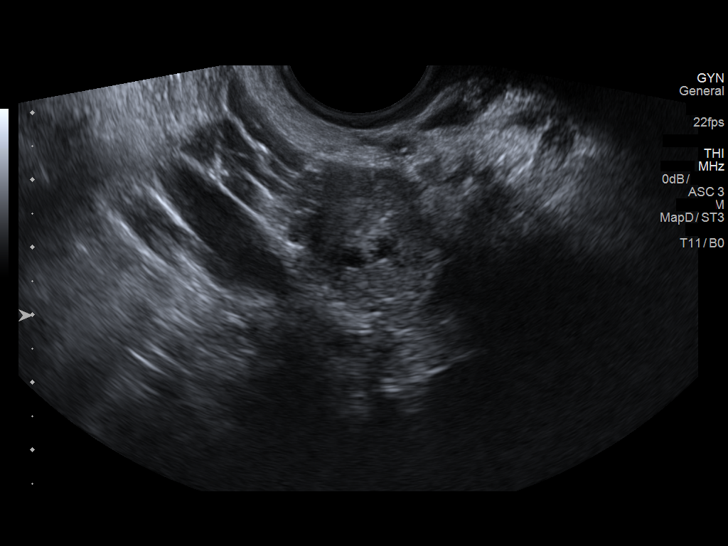
[im 46/55]
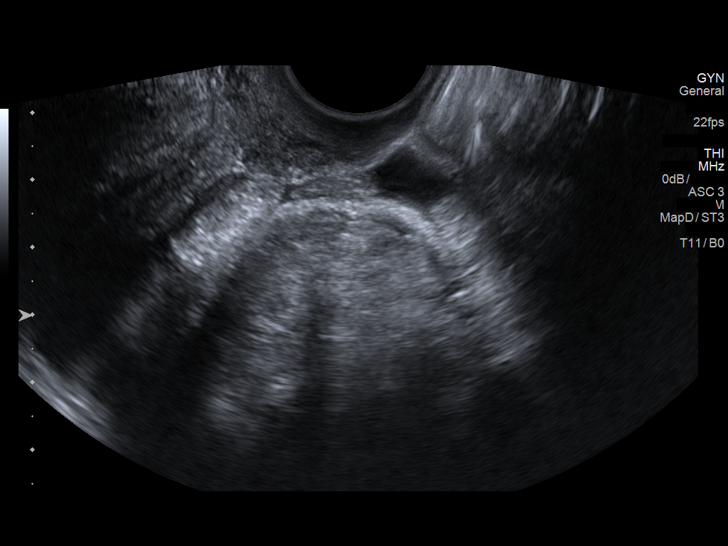
[im 50/55]
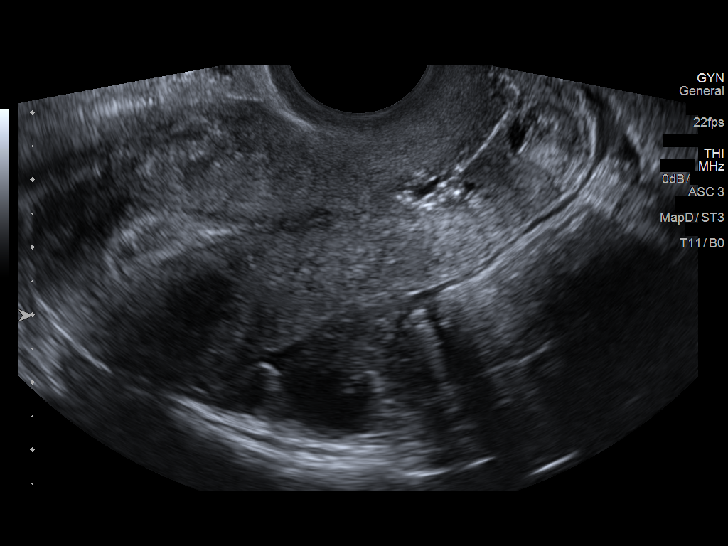
[im 55/55]
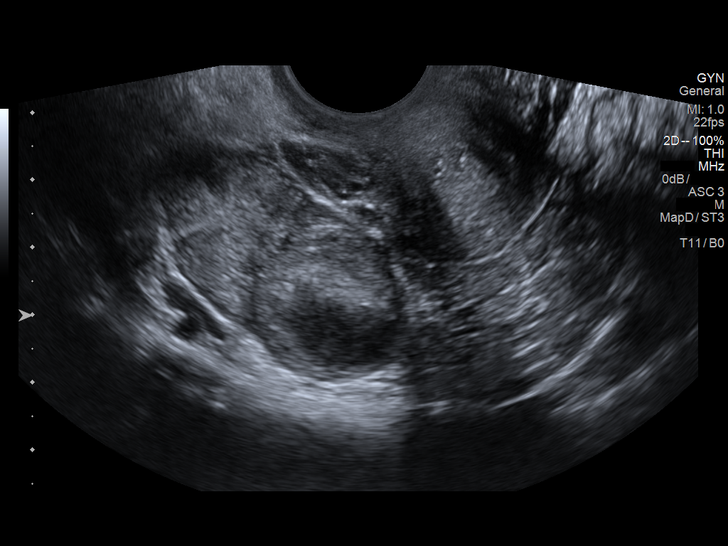

[13 of 25 positions shown; findings below may reference images not displayed]

FINDINGS: Uterus

Measurements: 8.8 x 4.5 x 5.6 cm. No fibroids or other mass
visualized.

Endometrium

Thickness: 9.4 mm.  No focal abnormality visualized.

Right ovary

Measurements: 4.2 x 2.4 x 3.1 cm. 2.5 x 2.1 x 2.0 complex lesion
without definite are internal blood flow. Lesion is partially cystic
with peripheral hyper echoic echotexture, favored to reflect a
corpus luteal cyst.

Left ovary

Measurements: 2.7 x 1.9 x 1.9 cm. Normal appearance/no adnexal mass.

Other findings

Trace free fluid, likely physiologic.
IMPRESSION: 1. 2.5 x 2.1 x 2.0 cm complex right ovarian lesion, favored to
reflect a degenerating corpus luteal cyst.
2. Trace free fluid within the pelvis, likely physiologic.

## 2021-03-31 ENCOUNTER — Other Ambulatory Visit: Payer: Self-pay

## 2021-03-31 ENCOUNTER — Ambulatory Visit: Payer: Self-pay

## 2021-03-31 ENCOUNTER — Encounter: Payer: Self-pay | Admitting: Orthopedic Surgery

## 2021-03-31 ENCOUNTER — Ambulatory Visit (INDEPENDENT_AMBULATORY_CARE_PROVIDER_SITE_OTHER): Payer: 59 | Admitting: Orthopedic Surgery

## 2021-03-31 DIAGNOSIS — G5602 Carpal tunnel syndrome, left upper limb: Secondary | ICD-10-CM | POA: Diagnosis not present

## 2021-03-31 DIAGNOSIS — M25532 Pain in left wrist: Secondary | ICD-10-CM

## 2021-04-03 NOTE — Progress Notes (Signed)
Office Visit Note   Patient: Toni Hall           Date of Birth: 05/03/1971           MRN: [id number] Visit Date: 03/31/2021 Requested by: Joice Lofts, NP [address] [place],  [place] [address] PCP: Joice Lofts, NP  Subjective: Chief Complaint  Patient presents with   Left Wrist - New Patient (Initial Visit)    HPI: Patient presents for evaluation of left wrist pain.  She had previous right carpal tunnel release done several years ago and has done well with that.  Reports increased tingling and numbness in her left hand.  No radiating pains.  Splint has been tried which has not helped.  She has been dropping things and reports diminished manual dexterity.  The pain wakes her from sleep at night.  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  Denies any neck pain.  Outside studies are reviewed and nerve conduction study from 2021 demonstrates left carpal tunnel syndrome.              ROS: All systems reviewed are negative as they relate to the chief complaint within the history of present illness.  Patient denies  fevers or chills.   Assessment & Plan: Visit Diagnoses:  1. Pain in left wrist   2. Carpal tunnel syndrome, left upper limb     Plan: Impression is left carpal tunnel syndrome.  She has had good result with her right carpal tunnel release done by me 6 years ago.  She is also had right Achilles tendon surgery done which helped.  She has failed conservative measures.  Plan at this time is left carpal tunnel release.  Risk and benefits are discussed with the patient including but not limited to infection nerve vessel damage wrist stiffness diminished grip strength as well as incomplete resolution of pain.  Patient understands risk and benefits and wishes to proceed.  All questions answered.  Follow-Up Instructions: No follow-ups on file.   Orders:  Orders Placed This Encounter  Procedures   XR Wrist Complete Left   No orders of the defined types were  placed in this encounter.     Procedures: No procedures performed   Clinical Data: No additional findings.  Objective: Vital Signs: There were no vitals taken for this visit.  Physical Exam:   Constitutional: Patient appears well-developed HEENT:  Head: Normocephalic Eyes:EOM are normal Neck: Normal range of motion Cardiovascular: Normal rate Pulmonary/chest: Effort normal Neurologic: Patient is alert Skin: Skin is warm Psychiatric: Patient has normal mood and affect   Ortho Exam: Ortho exam demonstrates full active and passive range of motion of the neck and left elbow.  Positive carpal tunnel compression testing on the left.  Radial pulse intact on the left-hand side.  Grip strength symmetric.  Abductor pollicis brevis function intact.  No masses lymphadenopathy or skin changes noted in that left wrist region.  No subluxation of the ulnar nerve.  Specialty Comments:  No specialty comments available.  Imaging: No results found.   PMFS History: Patient Active Problem List   Diagnosis Date Noted   Post-operative infection right achilles 03/15/2016   Past Medical History:  Diagnosis Date   Carpal tunnel syndrome     History reviewed. No pertinent family history.  Past Surgical History:  Procedure Laterality Date   ACHILLES TENDON REPAIR     HERNIA REPAIR     Social History   Occupational History   Not on  file  Tobacco Use   Smoking status: Never   Smokeless tobacco: Never  Substance and Sexual Activity   Alcohol use: No   Drug use: No   Sexual activity: Not on file

## 2021-04-15 ENCOUNTER — Other Ambulatory Visit: Payer: Self-pay

## 2021-04-15 ENCOUNTER — Encounter (HOSPITAL_BASED_OUTPATIENT_CLINIC_OR_DEPARTMENT_OTHER): Payer: Self-pay | Admitting: Orthopedic Surgery

## 2021-04-19 ENCOUNTER — Encounter (HOSPITAL_BASED_OUTPATIENT_CLINIC_OR_DEPARTMENT_OTHER)
Admission: RE | Admit: 2021-04-19 | Discharge: 2021-04-19 | Disposition: A | Discharge status: Home / Self Care | Payer: 59 | Source: Ambulatory Visit | Attending: Orthopedic Surgery | Admitting: Orthopedic Surgery

## 2021-04-19 DIAGNOSIS — Z01812 Encounter for preprocedural laboratory examination: Secondary | ICD-10-CM | POA: Insufficient documentation

## 2021-04-19 LAB — BASIC METABOLIC PANEL
Anion gap: 7 (ref 5–15)
BUN: <5 mg/dL — ABNORMAL LOW (ref 6–20)
CO2: 28 mmol/L (ref 22–32)
Calcium: 9.1 mg/dL (ref 8.9–10.3)
Chloride: 101 mmol/L (ref 98–111)
Creatinine, Ser: 0.7 mg/dL (ref 0.44–1.00)
GFR, Estimated: >60 mL/min (ref >60)
Glucose, Bld: 92 mg/dL (ref 70–99)
Potassium: 4.2 mmol/L (ref 3.5–5.1)
Sodium: 136 mmol/L (ref 135–145)

## 2021-04-19 LAB — CBC
HCT: 42.4 % (ref 36.0–46.0)
Hemoglobin: 13.8 g/dL (ref 12.0–15.0)
MCH: 26.6 pg (ref 26.0–34.0)
MCHC: 32.5 g/dL (ref 30.0–36.0)
MCV: 81.9 fL (ref 80.0–100.0)
Platelets: 242 10*3/uL (ref 150–400)
RBC: 5.18 MIL/uL — ABNORMAL HIGH (ref 3.87–5.11)
RDW: 13.5 % (ref 11.5–15.5)
WBC: 4.4 10*3/uL (ref 4.0–10.5)
nRBC: 0 % (ref 0.0–0.2)

## 2021-04-19 NOTE — Progress Notes (Signed)

## 2021-04-22 ENCOUNTER — Encounter (HOSPITAL_BASED_OUTPATIENT_CLINIC_OR_DEPARTMENT_OTHER): Payer: Self-pay | Admitting: Orthopedic Surgery

## 2021-04-22 ENCOUNTER — Ambulatory Visit (HOSPITAL_BASED_OUTPATIENT_CLINIC_OR_DEPARTMENT_OTHER): Payer: 59 | Admitting: Anesthesiology

## 2021-04-22 ENCOUNTER — Other Ambulatory Visit: Payer: Self-pay

## 2021-04-22 ENCOUNTER — Ambulatory Visit (HOSPITAL_BASED_OUTPATIENT_CLINIC_OR_DEPARTMENT_OTHER)
Admission: RE | Admit: 2021-04-22 | Discharge: 2021-04-22 | Disposition: A | Discharge status: Home / Self Care | Payer: 59 | Attending: Orthopedic Surgery | Admitting: Orthopedic Surgery

## 2021-04-22 ENCOUNTER — Encounter (HOSPITAL_BASED_OUTPATIENT_CLINIC_OR_DEPARTMENT_OTHER)
Admission: RE | Disposition: A | Discharge status: Home / Self Care | Payer: Self-pay | Source: Home / Self Care | Attending: Orthopedic Surgery

## 2021-04-22 DIAGNOSIS — G5602 Carpal tunnel syndrome, left upper limb: Secondary | ICD-10-CM | POA: Diagnosis not present

## 2021-04-22 HISTORY — PX: CARPAL TUNNEL RELEASE: SHX101

## 2021-04-22 LAB — POCT PREGNANCY, URINE: Preg Test, Ur: NEGATIVE

## 2021-04-22 SURGERY — CARPAL TUNNEL RELEASE
Anesthesia: General | Site: Hand | Laterality: Left

## 2021-04-22 MED ORDER — DEXAMETHASONE SODIUM PHOSPHATE 4 MG/ML IJ SOLN
INTRAMUSCULAR | Status: DC | PRN
  Administered 2021-04-22: 6 mg via INTRAVENOUS

## 2021-04-22 MED ORDER — CELECOXIB 200 MG PO CAPS: 200.0000 mg | ORAL_CAPSULE | Freq: Two times a day (BID) | ORAL | 0 refills | Status: DC

## 2021-04-22 MED ORDER — CEFAZOLIN SODIUM-DEXTROSE 2-4 GM/100ML-% IV SOLN
2.0000 g | INTRAVENOUS | Status: AC
  Administered 2021-04-22: 2 g via INTRAVENOUS

## 2021-04-22 MED ORDER — POVIDONE-IODINE 10 % EX SWAB
2.0000 "application " | Freq: Once | CUTANEOUS | Status: AC
  Administered 2021-04-22: 2 via TOPICAL

## 2021-04-22 MED ORDER — ACETAMINOPHEN 500 MG PO TABS
1000.0000 mg | ORAL_TABLET | Freq: Once | ORAL | Status: AC
  Administered 2021-04-22: 1000 mg via ORAL

## 2021-04-22 MED ORDER — BUPIVACAINE HCL (PF) 0.25 % IJ SOLN
INTRAMUSCULAR | Status: DC | PRN
  Administered 2021-04-22: 15 mL

## 2021-04-22 MED ORDER — KETOROLAC TROMETHAMINE 30 MG/ML IJ SOLN: 30.0000 mg | Freq: Once | INTRAMUSCULAR | Status: DC | PRN

## 2021-04-22 MED ORDER — ONDANSETRON HCL 4 MG/2ML IJ SOLN
INTRAMUSCULAR | Status: AC
  Filled 2021-04-22: qty 2

## 2021-04-22 MED ORDER — MIDAZOLAM HCL 5 MG/5ML IJ SOLN
INTRAMUSCULAR | Status: DC | PRN
  Administered 2021-04-22: 2 mg via INTRAVENOUS

## 2021-04-22 MED ORDER — BUPIVACAINE HCL (PF) 0.25 % IJ SOLN
INTRAMUSCULAR | Status: AC
  Filled 2021-04-22: qty 30

## 2021-04-22 MED ORDER — OXYCODONE HCL 5 MG PO TABS
5.0000 mg | ORAL_TABLET | Freq: Once | ORAL | Status: DC | PRN
Start: 2021-04-22 — End: 2021-04-22

## 2021-04-22 MED ORDER — PROMETHAZINE HCL 25 MG/ML IJ SOLN: 6.2500 mg | INTRAMUSCULAR | Status: DC | PRN

## 2021-04-22 MED ORDER — AMISULPRIDE (ANTIEMETIC) 5 MG/2ML IV SOLN: 10.0000 mg | Freq: Once | INTRAVENOUS | Status: DC | PRN

## 2021-04-22 MED ORDER — ACETAMINOPHEN 500 MG PO TABS
ORAL_TABLET | ORAL | Status: AC
  Filled 2021-04-22: qty 2

## 2021-04-22 MED ORDER — LACTATED RINGERS IV SOLN: INTRAVENOUS | Status: DC

## 2021-04-22 MED ORDER — LIDOCAINE 2% (20 MG/ML) 5 ML SYRINGE
INTRAMUSCULAR | Status: DC | PRN
  Administered 2021-04-22: 60 mg via INTRAVENOUS

## 2021-04-22 MED ORDER — PROPOFOL 10 MG/ML IV BOLUS
INTRAVENOUS | Status: AC
  Filled 2021-04-22: qty 20

## 2021-04-22 MED ORDER — POVIDONE-IODINE 7.5 % EX SOLN: Freq: Once | CUTANEOUS | Status: AC

## 2021-04-22 MED ORDER — TRAMADOL HCL 50 MG PO TABS: 50.0000 mg | ORAL_TABLET | Freq: Four times a day (QID) | ORAL | 0 refills | Status: DC | PRN

## 2021-04-22 MED ORDER — FENTANYL CITRATE (PF) 100 MCG/2ML IJ SOLN: 25.0000 ug | INTRAMUSCULAR | Status: DC | PRN

## 2021-04-22 MED ORDER — OXYCODONE HCL 5 MG/5ML PO SOLN: 5.0000 mg | Freq: Once | ORAL | Status: DC | PRN

## 2021-04-22 MED ORDER — MIDAZOLAM HCL 2 MG/2ML IJ SOLN
INTRAMUSCULAR | Status: AC
  Filled 2021-04-22: qty 2

## 2021-04-22 MED ORDER — 0.9 % SODIUM CHLORIDE (POUR BTL) OPTIME
TOPICAL | Status: DC | PRN
  Administered 2021-04-22: 50 mL

## 2021-04-22 MED ORDER — CEFAZOLIN SODIUM-DEXTROSE 2-4 GM/100ML-% IV SOLN
INTRAVENOUS | Status: AC
  Filled 2021-04-22: qty 100

## 2021-04-22 MED ORDER — DEXAMETHASONE SODIUM PHOSPHATE 10 MG/ML IJ SOLN
INTRAMUSCULAR | Status: AC
  Filled 2021-04-22: qty 1

## 2021-04-22 MED ORDER — FENTANYL CITRATE (PF) 100 MCG/2ML IJ SOLN
INTRAMUSCULAR | Status: AC
  Filled 2021-04-22: qty 2

## 2021-04-22 MED ORDER — BUPIVACAINE-EPINEPHRINE (PF) 0.25% -1:200000 IJ SOLN
INTRAMUSCULAR | Status: AC
  Filled 2021-04-22: qty 60

## 2021-04-22 MED ORDER — LIDOCAINE 2% (20 MG/ML) 5 ML SYRINGE
INTRAMUSCULAR | Status: AC
  Filled 2021-04-22: qty 5

## 2021-04-22 MED ORDER — ONDANSETRON HCL 4 MG/2ML IJ SOLN
INTRAMUSCULAR | Status: DC | PRN
  Administered 2021-04-22: 4 mg via INTRAVENOUS

## 2021-04-22 MED ORDER — KETOROLAC TROMETHAMINE 30 MG/ML IJ SOLN
INTRAMUSCULAR | Status: DC | PRN
  Administered 2021-04-22: 27 mg via INTRAVENOUS

## 2021-04-22 MED ORDER — PROPOFOL 10 MG/ML IV BOLUS
INTRAVENOUS | Status: DC | PRN
  Administered 2021-04-22: 200 mg via INTRAVENOUS

## 2021-04-22 MED ORDER — KETOROLAC TROMETHAMINE 30 MG/ML IJ SOLN
INTRAMUSCULAR | Status: AC
  Filled 2021-04-22: qty 1

## 2021-04-22 MED ORDER — FENTANYL CITRATE (PF) 100 MCG/2ML IJ SOLN
INTRAMUSCULAR | Status: DC | PRN
  Administered 2021-04-22 (×3): 25 ug via INTRAVENOUS

## 2021-04-22 SURGICAL SUPPLY — 40 items
BLADE SURG 15 STRL LF DISP TIS (BLADE) ×1 IMPLANT
BLADE SURG 15 STRL SS (BLADE) ×2
BNDG CMPR 9X4 STRL LF SNTH (GAUZE/BANDAGES/DRESSINGS) ×1
BNDG ELASTIC 3X5.8 VLCR STR LF (GAUZE/BANDAGES/DRESSINGS) ×2 IMPLANT
BNDG ELASTIC 4X5.8 VLCR STR LF (GAUZE/BANDAGES/DRESSINGS) ×2 IMPLANT
BNDG ESMARK 4X9 LF (GAUZE/BANDAGES/DRESSINGS) ×2 IMPLANT
CORD BIPOLAR FORCEPS 12FT (ELECTRODE) ×2 IMPLANT
COVER BACK TABLE 60X90IN (DRAPES) ×2 IMPLANT
COVER MAYO STAND STRL (DRAPES) ×2 IMPLANT
DRAPE EXTREMITY T 121X128X90 (DISPOSABLE) ×2 IMPLANT
DRAPE IMP U-DRAPE 54X76 (DRAPES) ×2 IMPLANT
DRAPE INCISE IOBAN 66X45 STRL (DRAPES) IMPLANT
DRAPE SURG 17X23 STRL (DRAPES) ×2 IMPLANT
DRSG PAD ABDOMINAL 8X10 ST (GAUZE/BANDAGES/DRESSINGS) ×2 IMPLANT
DURAPREP 26ML APPLICATOR (WOUND CARE) ×2 IMPLANT
GAUZE SPONGE 4X4 12PLY STRL (GAUZE/BANDAGES/DRESSINGS) ×2 IMPLANT
GAUZE XEROFORM 1X8 LF (GAUZE/BANDAGES/DRESSINGS) ×2 IMPLANT
GLOVE SURG POLYISO LF SZ6.5 (GLOVE) ×2 IMPLANT
GLOVE SURG UNDER POLY LF SZ7 (GLOVE) ×2 IMPLANT
GOWN STRL REUS W/ TWL LRG LVL3 (GOWN DISPOSABLE) ×2 IMPLANT
GOWN STRL REUS W/ TWL XL LVL3 (GOWN DISPOSABLE) ×2 IMPLANT
GOWN STRL REUS W/TWL LRG LVL3 (GOWN DISPOSABLE) ×4
GOWN STRL REUS W/TWL XL LVL3 (GOWN DISPOSABLE) ×4
NEEDLE HYPO 25X1 1.5 SAFETY (NEEDLE) ×2 IMPLANT
NS IRRIG 1000ML POUR BTL (IV SOLUTION) ×2 IMPLANT
PACK BASIN DAY SURGERY FS (CUSTOM PROCEDURE TRAY) ×2 IMPLANT
PAD CAST 3X4 CTTN HI CHSV (CAST SUPPLIES) IMPLANT
PAD CAST 4YDX4 CTTN HI CHSV (CAST SUPPLIES) ×1 IMPLANT
PADDING CAST COTTON 3X4 STRL (CAST SUPPLIES)
PADDING CAST COTTON 4X4 STRL (CAST SUPPLIES) ×2
SPLINT PLASTER CAST XFAST 3X15 (CAST SUPPLIES) ×10 IMPLANT
SPLINT PLASTER CAST XFAST 4X15 (CAST SUPPLIES) IMPLANT
SPLINT PLASTER XTRA FAST SET 4 (CAST SUPPLIES)
SPLINT PLASTER XTRA FASTSET 3X (CAST SUPPLIES) ×10
STOCKINETTE 4X48 STRL (DRAPES) ×2 IMPLANT
SUT ETHILON 3 0 PS 1 (SUTURE) ×4 IMPLANT
SYR BULB EAR ULCER 3OZ GRN STR (SYRINGE) IMPLANT
SYR CONTROL 10ML LL (SYRINGE) ×2 IMPLANT
TOWEL GREEN STERILE FF (TOWEL DISPOSABLE) ×2 IMPLANT
UNDERPAD 30X36 HEAVY ABSORB (UNDERPADS AND DIAPERS) ×2 IMPLANT

## 2021-04-22 NOTE — Anesthesia Postprocedure Evaluation (Signed)
Anesthesia Post Note  Patient: Toni Hall  Procedure(s) Performed: LEFT CARPAL TUNNEL RELEASE (Left: Hand)     Patient location during evaluation: PACU Anesthesia Type: General Level of consciousness: awake Pain management: pain level controlled Vital Signs Assessment: post-procedure vital signs reviewed and stable Respiratory status: spontaneous breathing, nonlabored ventilation, respiratory function stable and patient connected to nasal cannula oxygen Cardiovascular status: blood pressure returned to baseline and stable Postop Assessment: no apparent nausea or vomiting Anesthetic complications: no   No notable events documented.  Last Vitals:  Vitals:   04/22/21 0845 04/22/21 0900  BP: 130/84 (!) 146/84  Pulse: 60 62  Resp: 20 16  Temp:  36.4 C  SpO2: 97% 96%    Last Pain:  Vitals:   04/22/21 0900  TempSrc: Oral  PainSc: 0-No pain                 Alechia Lezama P Prabhjot Maddux

## 2021-04-22 NOTE — Anesthesia Procedure Notes (Signed)
Procedure Name: LMA Insertion Date/Time: 04/22/2021 7:33 AM Performed by: Alford Highland, CRNA Pre-anesthesia Checklist: Patient identified, Emergency Drugs available, Suction available and Patient being monitored Patient Re-evaluated:Patient Re-evaluated prior to induction Oxygen Delivery Method: Circle System Utilized Preoxygenation: Pre-oxygenation with 100% oxygen Induction Type: IV induction Ventilation: Mask ventilation without difficulty LMA: LMA inserted LMA Size: 4.0 Number of attempts: 1 Airway Equipment and Method: Bite block Placement Confirmation: positive ETCO2 Tube secured with: Tape Dental Injury: Teeth and Oropharynx as per pre-operative assessment

## 2021-04-22 NOTE — Anesthesia Preprocedure Evaluation (Addendum)
Anesthesia Evaluation  Patient identified by MRN, date of birth, ID band Patient awake    Reviewed: Allergy & Precautions, NPO status , Patient's Chart, lab work & pertinent test results  Airway Mallampati: III  TM Distance: >3 FB Neck ROM: Full    Dental no notable dental hx.    Pulmonary neg pulmonary ROS,    Pulmonary exam normal breath sounds clear to auscultation       Cardiovascular negative cardio ROS Normal cardiovascular exam Rhythm:Regular Rate:Normal     Neuro/Psych PSYCHIATRIC DISORDERS negative neurological ROS     GI/Hepatic negative GI ROS, Neg liver ROS,   Endo/Other  negative endocrine ROS  Renal/GU negative Renal ROS     Musculoskeletal negative musculoskeletal ROS (+)   Abdominal   Peds  Hematology negative hematology ROS (+)   Anesthesia Other Findings left carpal tunnel syndrome  Reproductive/Obstetrics hcg negative                            Anesthesia Physical Anesthesia Plan  ASA: 1  Anesthesia Plan: General   Post-op Pain Management:    Induction: Intravenous  PONV Risk Score and Plan: 3 and Ondansetron, Dexamethasone, Midazolam and Treatment may vary due to age or medical condition  Airway Management Planned: LMA  Additional Equipment:   Intra-op Plan:   Post-operative Plan: Extubation in OR  Informed Consent: I have reviewed the patients History and Physical, chart, labs and discussed the procedure including the risks, benefits and alternatives for the proposed anesthesia with the patient or authorized representative who has indicated his/her understanding and acceptance.     Dental advisory given and Interpreter used for interveiw  Plan Discussed with: CRNA  Anesthesia Plan Comments:         Anesthesia Quick Evaluation

## 2021-04-22 NOTE — Brief Op Note (Signed)
   04/22/2021  8:23 AM  PATIENT:  Toni Hall  50 y.o. female  PRE-OPERATIVE DIAGNOSIS:  left carpal tunnel syndrome  POST-OPERATIVE DIAGNOSIS:  left carpal tunnel syndrome  PROCEDURE:  Procedure(s): LEFT CARPAL TUNNEL RELEASE  SURGEON:  Surgeon(s): August Saucer, Corrie Mckusick, MD  ASSISTANT: magnant pa  ANESTHESIA:   general  EBL: 4 ml    No intake/output data recorded.  BLOOD ADMINISTERED: none  DRAINS: none   LOCAL MEDICATIONS USED:  marcaine  SPECIMEN:  No Specimen  COUNTS:  YES  TOURNIQUET:   Total Tourniquet Time Documented: Forearm (Left) - 13 minutes Total: Forearm (Left) - 13 minutes   DICTATION: .Other Dictation: Dictation Number [id number]  PLAN OF CARE: Discharge to home after PACU  PATIENT DISPOSITION:  PACU - hemodynamically stable

## 2021-04-22 NOTE — Transfer of Care (Signed)
Immediate Anesthesia Transfer of Care Note  Patient: Toni Hall  Procedure(s) Performed: LEFT CARPAL TUNNEL RELEASE (Left: Hand)  Patient Location: PACU  Anesthesia Type:General  Level of Consciousness: drowsy  Airway & Oxygen Therapy: Patient Spontanous Breathing and Patient connected to face mask oxygen  Post-op Assessment: Report given to RN and Post -op Vital signs reviewed and stable  Post vital signs: Reviewed and stable  Last Vitals:  Vitals Value Taken Time  BP 144/94   Temp    Pulse 51 04/22/21 0825  Resp 9 04/22/21 0825  SpO2 100 % 04/22/21 0825  Vitals shown include unvalidated device data.  Last Pain:  Vitals:   04/22/21 0654  TempSrc: Oral  PainSc: 0-No pain      Patients Stated Pain Goal: 10 (04/22/21 0654)  Complications: No notable events documented.

## 2021-04-22 NOTE — H&P (Signed)
Toni Hall is an 50 y.o. female.   Chief Complaint: left wrist pain HPI: HPI: Patient presents for evaluation of left wrist pain.  She had previous right carpal tunnel release done several years ago and has done well with that.  Reports increased tingling and numbness in her left hand.  No radiating pains.  Splint has been tried which has not helped.  She has been dropping things and reports diminished manual dexterity.  The pain wakes her from sleep at night.  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  Denies any neck pain.  Outside studies are reviewed and nerve conduction study from 2021 demonstrates left carpal tunnel syndrome.  Past Medical History:  Diagnosis Date   Carpal tunnel syndrome     Past Surgical History:  Procedure Laterality Date   ACHILLES TENDON REPAIR     CARPAL TUNNEL RELEASE Bilateral    EYE SURGERY Bilateral    FEMUR SURGERY Left    HERNIA REPAIR      History reviewed. No pertinent family history. Social History:  reports that she has never smoked. She has never used smokeless tobacco. She reports that she does not drink alcohol and does not use drugs.  Allergies: No Known Allergies  Medications Prior to Admission  Medication Sig Dispense Refill   FLUoxetine HCl (PROZAC PO) Take by mouth.     GABAPENTIN PO Take by mouth.     methocarbamol (ROBAXIN) 500 MG tablet Take 500 mg by mouth every 8 (eight) hours as needed for muscle spasms.     Multiple Vitamin (MULTIVITAMIN PO) Take by mouth.     aspirin 325 MG EC tablet Take 325 mg by mouth daily as needed for pain. Reported on 01/08/2016     doxycycline (VIBRA-TABS) 100 MG tablet Take 100 mg by mouth 2 (two) times daily. For 14 days     ibuprofen (ADVIL,MOTRIN) 800 MG tablet Take 1 tablet (800 mg total) by mouth 2 (two) times daily. 30 tablet 0   oxycodone (OXY-IR) 5 MG capsule Take 1 capsule (5 mg total) by mouth every 4 (four) hours as needed. 30 capsule 0   oxyCODONE-acetaminophen (PERCOCET/ROXICET)  5-325 MG tablet Take 1 tablet by mouth every 6 (six) hours as needed for severe pain. 30 tablet 0    No results found for this or any previous visit (from the past 48 hour(s)). No results found.  Review of Systems  Musculoskeletal:  Positive for arthralgias.  All other systems reviewed and are negative.  Height 5\' 3"  (1.6 m), weight 59 kg, last menstrual period 04/14/2021. Physical Exam Vitals reviewed.  HENT:     Head: Normocephalic.     Nose: Nose normal.     Mouth/Throat:     Mouth: Mucous membranes are moist.  Eyes:     Pupils: Pupils are equal, round, and reactive to light.  Cardiovascular:     Rate and Rhythm: Normal rate.     Pulses: Normal pulses.  Pulmonary:     Effort: Pulmonary effort is normal.  Abdominal:     General: Abdomen is flat.  Musculoskeletal:     Cervical back: Normal range of motion.  Skin:    General: Skin is warm.     Capillary Refill: Capillary refill takes less than 2 seconds.  Neurological:     General: No focal deficit present.     Mental Status: She is alert.  Psychiatric:        Mood and Affect: Mood normal.   Ortho Exam: Ortho  exam demonstrates full active and passive range of motion of the neck and left elbow.  Positive carpal tunnel compression testing on the left.  Radial pulse intact on the left-hand side.  Grip strength symmetric.  Abductor pollicis brevis function intact.  No masses lymphadenopathy or skin changes noted in that left wrist region.  No subluxation of the ulnar nerve.  : Impression is left carpal tunnel syndrome.  She has had good result with her right carpal tunnel release done by me 6 years ago.  She is also had right Achilles tendon surgery done which helped.  She has failed conservative measures.  Plan at this time is left carpal tunnel release.  Risk and benefits are discussed with the patient including but not limited to infection nerve vessel damage wrist stiffness diminished grip strength as well as incomplete  resolution of pain.  Patient understands risk and benefits and wishes to proceed.  All questions answered.  Burnard Bunting, MD 04/22/2021, 6:37 AM

## 2021-04-22 NOTE — Op Note (Signed)
NAME: Toni Hall, Toni Hall MEDICAL RECORD NO: [id number] ACCOUNT NO: [id number] DATE OF BIRTH: 03/24/1971 FACILITY: [facility] LOCATION: [facility] PHYSICIAN: Graylin Shiver. August Saucer, MD  Operative Report   DATE OF PROCEDURE: 04/22/2021  PREOPERATIVE DIAGNOSIS:  Left carpal tunnel syndrome.  POSTOPERATIVE DIAGNOSIS:  Left carpal tunnel syndrome.  PROCEDURE:  Left carpal tunnel release.  SURGEON:  Graylin Shiver. August Saucer, MD  ASSISTANT:  Karenann Cai, PA.  INDICATIONS:  The patient is a 50 year old female with left carpal tunnel syndrome.  Did well with right carpal tunnel surgery several years ago.  Has EMG proven moderate to severe carpal tunnel syndrome on the left hand side, presents now for operative  management after failure of conservative management.  DESCRIPTION OF PROCEDURE:  The patient was brought to the operating room where general anesthetic was induced.  Preoperative antibiotics were administered.  Timeout was called.  The left hand was pre-scrubbed with alcohol, Betadine, and let it air dry.   Prepped with DuraPrep solution and draped in a sterile manner.  Collier Flowers was not utilized.  Hand was elevated and exsanguinated with an Esmarch wrap.  Tourniquet was inflated.  Total tourniquet time 14 minutes.  Incision was made at the intersection of  Kaplan's cardinal line and the radial border of the fourth finger.  Skin and subcutaneous tissue were sharply divided.  Bleeding crossing veins were coagulated using bipolar electrocautery.  Palmar fascia was incised.  Transverse carpal ligament was  visualized.  Transverse carpal ligament was divided 2-3 mm along its middle portion.  Right angle retractor was then used to lift the transverse carpal ligament off of the median nerve.  Under direct visualization, the transverse carpal ligament was  released distally along its length and proximally to the forearm fascia.  No masses within the carpal canal.  Motor branch intact.  Thorough irrigation was  performed.  Skin edges were anesthetized using Marcaine plain.  Tourniquet released.  Bleeding  points encountered were controlled using bipolar electrocautery.  Irrigation performed.  Skin closed using simple 3-0 nylon sutures with Xeroform and bulky dressing and volar splint applied.  The patient tolerated the procedure well without immediate  complication.  Luke's assistance was required for opening, closing, mobilization of tissue.  His assistance was a medical necessity.       [email or web address] D: 04/22/2021 8:26:35 am T: 04/22/2021 9:35:00 am  JOB: [id number]/ [id number]

## 2021-04-22 NOTE — Discharge Instructions (Signed)

## 2021-04-23 ENCOUNTER — Encounter (HOSPITAL_BASED_OUTPATIENT_CLINIC_OR_DEPARTMENT_OTHER): Payer: Self-pay | Admitting: Orthopedic Surgery

## 2021-04-24 DIAGNOSIS — G5602 Carpal tunnel syndrome, left upper limb: Secondary | ICD-10-CM

## 2021-05-03 ENCOUNTER — Ambulatory Visit (INDEPENDENT_AMBULATORY_CARE_PROVIDER_SITE_OTHER): Payer: 59 | Admitting: Surgical

## 2021-05-03 ENCOUNTER — Encounter: Payer: Self-pay | Admitting: Orthopedic Surgery

## 2021-05-03 ENCOUNTER — Other Ambulatory Visit: Payer: Self-pay

## 2021-05-03 DIAGNOSIS — G5602 Carpal tunnel syndrome, left upper limb: Secondary | ICD-10-CM

## 2021-05-03 NOTE — Progress Notes (Signed)
   Post-Op Visit Note   Patient: Toni Hall           Date of Birth: 01/08/1971           MRN: [id number] Visit Date: 05/03/2021 PCP: Joice Lofts, NP   Assessment & Plan:  Chief Complaint:  Chief Complaint  Patient presents with   Right Wrist - Routine Post Op    04/22/21 Right CTR   Visit Diagnoses:  1. Carpal tunnel syndrome, left upper limb     Plan: Patient is a 50 year old female who presents s/p left carpal tunnel release on 04/22/2021.  She reports 0/10 pain with no medications that she is taking for pain.  She denies any fevers, chills, night sweats.  She has slight tingling and numbness in the fingertips but nothing that causes her significant discomfort.  She is currently out of work.  Splint removed today.  Incision looks to be healing well.  No evidence of infection or dehiscence.  Sutures removed and replaced with Steri-Strips.  After removal of the sutures, there was no significant gapping at the incision site.  Intact EPL, FPL, finger abduction, finger adduction, wrist extension.  2+ radial pulse of the left upper extremity.  Plan to have patient hold off on lifting anything heavier than 2 pounds with the operative arm.  Okay to use left hand for activities of daily living but avoid any submersion underwater such as bath, pool, hot tub, doing dishes.  Patient agrees with this plan.  Also provide a work note out of work from 10/13-11/14.  Follow-up in 4 weeks for clinical recheck.  Follow-Up Instructions: No follow-ups on file.   Orders:  No orders of the defined types were placed in this encounter.  No orders of the defined types were placed in this encounter.   Imaging: No results found.  PMFS History: Patient Active Problem List   Diagnosis Date Noted   Carpal tunnel syndrome, left upper limb    Post-operative infection right achilles 03/15/2016   Past Medical History:  Diagnosis Date   Carpal tunnel syndrome     No family history on  file.  Past Surgical History:  Procedure Laterality Date   ACHILLES TENDON REPAIR     CARPAL TUNNEL RELEASE Bilateral    CARPAL TUNNEL RELEASE Left 04/22/2021   Procedure: LEFT CARPAL TUNNEL RELEASE;  Surgeon: Cammy Copa, MD;  Location: [facility];  Service: Orthopedics;  Laterality: Left;   EYE SURGERY Bilateral    FEMUR SURGERY Left    HERNIA REPAIR     Social History   Occupational History   Not on file  Tobacco Use   Smoking status: Never   Smokeless tobacco: Never  Substance and Sexual Activity   Alcohol use: No   Drug use: No   Sexual activity: Not on file

## 2021-05-18 ENCOUNTER — Telehealth: Payer: Self-pay | Admitting: Orthopedic Surgery

## 2021-05-18 NOTE — Telephone Encounter (Signed)
Please advise 

## 2021-05-18 NOTE — Telephone Encounter (Signed)
Sure that's okay with me

## 2021-05-18 NOTE — Telephone Encounter (Signed)
Patient came in stating she is still having trouble with her hand and asked if she can go back to work  on 06/07/2021. Patient said she will need a note for her employer. The number to contact  Byrd Hesselbach (daughter)     [phone]      Please call daughter Patient speaks spanish

## 2021-05-19 NOTE — Telephone Encounter (Signed)
Note at front for pick up. Byrd Hesselbach advised.

## 2021-05-28 ENCOUNTER — Ambulatory Visit (INDEPENDENT_AMBULATORY_CARE_PROVIDER_SITE_OTHER): Payer: 59 | Admitting: Orthopedic Surgery

## 2021-05-28 ENCOUNTER — Encounter: Payer: Self-pay | Admitting: Orthopedic Surgery

## 2021-05-28 ENCOUNTER — Other Ambulatory Visit: Payer: Self-pay

## 2021-05-28 DIAGNOSIS — G5602 Carpal tunnel syndrome, left upper limb: Secondary | ICD-10-CM

## 2021-05-28 NOTE — Progress Notes (Signed)
   Post-Op Visit Note   Patient: Toni Hall           Date of Birth: October 30, 1970           MRN: [id number] Visit Date: 05/28/2021 PCP: Joice Lofts, NP   Assessment & Plan:  Chief Complaint:  Chief Complaint  Patient presents with   Left Wrist - Routine Post Op     04/22/21 Carpal tunnel syndrome, left upper limb     Visit Diagnoses:  1. Carpal tunnel syndrome, left upper limb     Plan: Patient presents 4 weeks out left carpal tunnel release.  She describes good range of motion with some occasional pain around the base of the incision.  Thumb gets a little stiff at times.  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  She wants to return to work 05/30/2021.  On exam she has improving grip strength.  Incision intact.  Mild pain over the [place] form.  Wrist range of motion good.  At this time I think it is okay for her to return to work 06/07/2021 but no lifting more than 5 pounds for the first 6 weeks back then regular duty.  Follow-up with me as needed.  Follow-Up Instructions: Return if symptoms worsen or fail to improve.   Orders:  No orders of the defined types were placed in this encounter.  No orders of the defined types were placed in this encounter.   Imaging: No results found.  PMFS History: Patient Active Problem List   Diagnosis Date Noted   Carpal tunnel syndrome, left upper limb    Post-operative infection right achilles 03/15/2016   Past Medical History:  Diagnosis Date   Carpal tunnel syndrome     No family history on file.  Past Surgical History:  Procedure Laterality Date   ACHILLES TENDON REPAIR     CARPAL TUNNEL RELEASE Bilateral    CARPAL TUNNEL RELEASE Left 04/22/2021   Procedure: LEFT CARPAL TUNNEL RELEASE;  Surgeon: Cammy Copa, MD;  Location: [facility];  Service: Orthopedics;  Laterality: Left;   EYE SURGERY Bilateral    FEMUR SURGERY Left    HERNIA REPAIR     Social History    Occupational History   Not on file  Tobacco Use   Smoking status: Never   Smokeless tobacco: Never  Substance and Sexual Activity   Alcohol use: No   Drug use: No   Sexual activity: Not on file

## 2021-12-21 ENCOUNTER — Ambulatory Visit
Admission: EM | Admit: 2021-12-21 | Discharge: 2021-12-21 | Disposition: A | Discharge status: Home / Self Care | Payer: 59 | Attending: Emergency Medicine | Admitting: Emergency Medicine

## 2021-12-21 DIAGNOSIS — H60391 Other infective otitis externa, right ear: Secondary | ICD-10-CM

## 2021-12-21 DIAGNOSIS — H66001 Acute suppurative otitis media without spontaneous rupture of ear drum, right ear: Secondary | ICD-10-CM | POA: Diagnosis not present

## 2021-12-21 MED ORDER — IBUPROFEN 800 MG PO TABS: 800.0000 mg | ORAL_TABLET | Freq: Three times a day (TID) | ORAL | 0 refills | Status: DC | PRN

## 2021-12-21 MED ORDER — KETOROLAC TROMETHAMINE 30 MG/ML IJ SOLN
30.0000 mg | Freq: Once | INTRAMUSCULAR | Status: AC
  Administered 2021-12-21: 30 mg via INTRAMUSCULAR

## 2021-12-21 MED ORDER — CEFDINIR 300 MG PO CAPS: 300.0000 mg | ORAL_CAPSULE | Freq: Two times a day (BID) | ORAL | 0 refills | Status: AC

## 2021-12-21 MED ORDER — CIPROFLOXACIN HCL 0.2 % OT SOLN: 0.2000 mL | Freq: Two times a day (BID) | OTIC | 0 refills | Status: DC

## 2021-12-21 NOTE — ED Triage Notes (Signed)
Pt c/o pain to right ear, she states she gets ear infections to that ear periodically.  Started: a week and a half ago Home interventions: amoxicillin (previously prescribed), tylenol, motrin

## 2021-12-21 NOTE — Discharge Instructions (Signed)
To treat the infection in your right ear, please begin taking cefdinir 1 capsule twice daily and be sure you complete a full 10 days.  Complete treatment can result in recurrent infection which could be significantly worse than the infection that you have at this time.  Please do not attempt to treat infections at home with inappropriate antibiotics.  Taking amoxicillin may actually have contributed to the worsening infection in your ear canal.  Please also begin using Ciprodex eardrops, place 4 drops into your right ear twice daily for the next 7 days.  Please return to see [place] in 5 days so that we can reevaluate your ear to see if we can remove any of the purulent material in your right ear for you.  After 3 days of taking cefdinir and using Ciprodex drops, if you continue to have significant pain in your right ear or your pain becomes worse, I recommend that you go to the emergency room for further evaluation.  You may require imaging of your head and stronger antibiotics than we can provide for you here at urgent care.  I have sent a prescription for ibuprofen 800 mg that you can take 3 times daily as needed for pain.  You can also apply ice to your ear to soothe and reduce inflammation as well.  Thank you for visiting urgent care today.

## 2021-12-21 NOTE — ED Notes (Signed)
Patient states her daughter can translate for her .

## 2021-12-21 NOTE — ED Provider Notes (Signed)
[facility]    CSN: [id number] Arrival date & time: 12/21/21  1515    HISTORY   Chief Complaint  Patient presents with   Otalgia   HPI Toni Hall is a 51 y.o. female. A language interpreter was used (Daughter).  Otalgia Location:  Right Behind ear:  Redness Quality:  Aching, sharp and shooting Severity:  Severe Onset quality:  Gradual Duration:  1 week Timing:  Constant Progression:  Worsening Chronicity:  Recurrent Context: recent URI   Relieved by:  Nothing Worsened by:  Coughing and swallowing Ineffective treatments:  OTC medications (Patient states she also tried taking amoxicillin which she had at home) Associated symptoms: congestion, cough, rhinorrhea and sore throat   Risk factors: chronic ear infection   Risk factors: no recent travel and no prior ear surgery    Past Medical History:  Diagnosis Date   Carpal tunnel syndrome    Patient Active Problem List   Diagnosis Date Noted   Carpal tunnel syndrome, left upper limb    Post-operative infection right achilles 03/15/2016   Past Surgical History:  Procedure Laterality Date   ACHILLES TENDON REPAIR     CARPAL TUNNEL RELEASE Bilateral    CARPAL TUNNEL RELEASE Left 04/22/2021   Procedure: LEFT CARPAL TUNNEL RELEASE;  Surgeon: Cammy Copa, MD;  Location: [facility];  Service: Orthopedics;  Laterality: Left;   EYE SURGERY Bilateral    FEMUR SURGERY Left    HERNIA REPAIR     OB History   No obstetric history on file.    Home Medications    Prior to Admission medications   Medication Sig Start Date End Date Taking? Authorizing Provider  cefdinir (OMNICEF) 300 MG capsule Take 1 capsule (300 mg total) by mouth 2 (two) times daily for 10 days. 12/21/21 12/31/21 Yes Theadora Rama Scales, PA-C  Ciprofloxacin HCl 0.2 % otic solution Place 0.2 mLs into the right ear 2 (two) times daily for 7 days. 12/21/21 12/28/21 Yes Theadora Rama Scales, PA-C  ibuprofen  (ADVIL) 800 MG tablet Take 1 tablet (800 mg total) by mouth every 8 (eight) hours as needed for up to 14 doses. 12/21/21  Yes Theadora Rama Scales, PA-C  aspirin 325 MG EC tablet Take 325 mg by mouth daily as needed for pain. Reported on 01/08/2016    [provider]  FLUoxetine HCl (PROZAC PO) Take by mouth.    [provider]  GABAPENTIN PO Take by mouth.    [provider]  methocarbamol (ROBAXIN) 500 MG tablet Take 500 mg by mouth every 8 (eight) hours as needed for muscle spasms.    [provider]  Multiple Vitamin (MULTIVITAMIN PO) Take by mouth.    [provider]   Family History History reviewed. No pertinent family history. Social History Social History   Tobacco Use   Smoking status: Never   Smokeless tobacco: Never  Substance Use Topics   Alcohol use: No   Drug use: No   Allergies   Patient has no known allergies.  Review of Systems Review of Systems  HENT:  Positive for congestion, ear pain, rhinorrhea and sore throat.   Respiratory:  Positive for cough.    Pertinent findings noted in history of present illness.   Physical Exam Triage Vital Signs ED Triage Vitals  Enc Vitals Group     BP 05/07/21 0827 (!) 147/82     Pulse Rate 05/07/21 0827 72     Resp 05/07/21 0827 18  Temp 05/07/21 0827 98.3 F (36.8 C)     Temp Source 05/07/21 0827 Oral     SpO2 05/07/21 0827 98 %     Weight --      Height --      Head Circumference --      Peak Flow --      Pain Score 05/07/21 0826 5     Pain Loc --      Pain Edu? --      Excl. in GC? --   No data found.  Updated Vital Signs BP 122/85 (BP Location: Right Arm)   Pulse 70   Temp 99.2 F (37.3 C) (Oral)   Resp 18   SpO2 96%   Physical Exam Vitals and nursing note reviewed.  Constitutional:      General: She is not in acute distress.    Appearance: Normal appearance. She is not ill-appearing.  HENT:     Head: Normocephalic and atraumatic.     Jaw: Tenderness  present.     Salivary Glands: Right salivary gland is not diffusely enlarged or tender. Left salivary gland is not diffusely enlarged or tender.     Right Ear: Hearing and external ear normal. No drainage. A middle ear effusion is present. There is no impacted cerumen. Tympanic membrane is injected, erythematous and retracted. Tympanic membrane is not bulging.     Left Ear: Hearing, tympanic membrane, ear canal and external ear normal. No drainage.  No middle ear effusion. There is no impacted cerumen. Tympanic membrane is not erythematous or bulging.     Nose: Nose normal. No nasal deformity, septal deviation, mucosal edema, congestion or rhinorrhea.     Right Turbinates: Not enlarged, swollen or pale.     Left Turbinates: Not enlarged, swollen or pale.     Right Sinus: No maxillary sinus tenderness or frontal sinus tenderness.     Left Sinus: No maxillary sinus tenderness or frontal sinus tenderness.     Mouth/Throat:     Lips: Pink. No lesions.     Mouth: Mucous membranes are moist. No oral lesions.     Pharynx: Oropharynx is clear. Uvula midline. No posterior oropharyngeal erythema or uvula swelling.     Tonsils: No tonsillar exudate. 0 on the right. 0 on the left.  Eyes:     General: Lids are normal.        Right eye: No discharge.        Left eye: No discharge.     Extraocular Movements: Extraocular movements intact.     Conjunctiva/sclera: Conjunctivae normal.     Right eye: Right conjunctiva is not injected.     Left eye: Left conjunctiva is not injected.  Neck:     Trachea: Trachea and phonation normal.  Cardiovascular:     Rate and Rhythm: Normal rate and regular rhythm.     Pulses: Normal pulses.     Heart sounds: Normal heart sounds. No murmur heard.    No friction rub. No gallop.  Pulmonary:     Effort: Pulmonary effort is normal. No accessory muscle usage, prolonged expiration or respiratory distress.     Breath sounds: Normal breath sounds. No stridor, decreased air  movement or transmitted upper airway sounds. No decreased breath sounds, wheezing, rhonchi or rales.  Chest:     Chest wall: No tenderness.  Musculoskeletal:        General: Normal range of motion.     Cervical back: Normal range of motion and neck supple.  Normal range of motion.  Lymphadenopathy:     Cervical: No cervical adenopathy.  Skin:    General: Skin is warm and dry.     Findings: No erythema or rash.  Neurological:     General: No focal deficit present.     Mental Status: She is alert and oriented to person, place, and time.  Psychiatric:        Mood and Affect: Mood normal.        Behavior: Behavior normal.     Visual Acuity Right Eye Distance:   Left Eye Distance:   Bilateral Distance:    Right Eye Near:   Left Eye Near:    Bilateral Near:     UC Couse / Diagnostics / Procedures:    EKG  Radiology No results found.  Procedures Procedures (including critical care time)  UC Diagnoses / Final Clinical Impressions(s)   I have reviewed the triage vital signs and the nursing notes.  Pertinent labs & imaging results that were available during my care of the patient were reviewed by me and considered in my medical decision making (see chart for details).   Final diagnoses:  Acute suppurative otitis media of right ear  Acute infective otitis externa of right ear   Patient provided with Ciprodex and Omnicef advised to take all as actively as directed.  Patient asked if she can return to have her ear cleaned out and advised her that would be fine after 5 to 7 days.  Patient advised to go the emergency room if she has worsening pain despite treatment.  ED Prescriptions     Medication Sig Dispense Auth. Provider   Ciprofloxacin HCl 0.2 % otic solution Place 0.2 mLs into the right ear 2 (two) times daily for 7 days. 1 each Theadora Rama Scales, PA-C   cefdinir (OMNICEF) 300 MG capsule Take 1 capsule (300 mg total) by mouth 2 (two) times daily for 10 days. 20  capsule Theadora Rama Scales, PA-C   ibuprofen (ADVIL) 800 MG tablet Take 1 tablet (800 mg total) by mouth every 8 (eight) hours as needed for up to 14 doses. 14 tablet Theadora Rama Scales, PA-C      PDMP not reviewed this encounter.  Pending results:  Labs Reviewed - No data to display  Medications Ordered in UC: Medications  ketorolac (TORADOL) 30 MG/ML injection 30 mg (has no administration in time range)    Disposition Upon Discharge:  Condition: stable for discharge home Home: take medications as prescribed; routine discharge instructions as discussed; follow up as advised.  Patient presented with an acute illness with associated systemic symptoms and significant discomfort requiring urgent management. In my opinion, this is a condition that a prudent lay person (someone who possesses an average knowledge of health and medicine) may potentially expect to result in complications if not addressed urgently such as respiratory distress, impairment of bodily function or dysfunction of bodily organs.   Routine symptom specific, illness specific and/or disease specific instructions were discussed with the patient and/or caregiver at length.   As such, the patient has been evaluated and assessed, work-up was performed and treatment was provided in alignment with urgent care protocols and evidence based medicine.  Patient/parent/caregiver has been advised that the patient may require follow up for further testing and treatment if the symptoms continue in spite of treatment, as clinically indicated and appropriate.  If the patient was tested for COVID-19, Influenza and/or RSV, then the patient/parent/guardian was advised to isolate at home  pending the results of his/her diagnostic coronavirus test and potentially longer if they're positive. I have also advised pt that if his/her COVID-19 test returns positive, it's recommended to self-isolate for at least 10 days after symptoms first  appeared AND until fever-free for 24 hours without fever reducer AND other symptoms have improved or resolved. Discussed self-isolation recommendations as well as instructions for household member/close contacts as per the [facility] and [place] [facility], and also gave patient the COVID packet with this information.  Patient/parent/caregiver has been advised [facility] in 3-5 days if no better; to PCP or the Emergency Department if new signs and symptoms develop, or if the current signs or symptoms continue to change or worsen for further workup, evaluation and treatment as clinically indicated and appropriate  The patient will follow up with their current PCP if and as advised. If the patient does not currently have a PCP we will assist them in obtaining one.   The patient may need specialty follow up if the symptoms continue, in spite of conservative treatment and management, for further workup, evaluation, consultation and treatment as clinically indicated and appropriate.  Patient/parent/caregiver verbalized understanding and agreement of plan as discussed.  All questions were addressed during visit.  Please see discharge instructions below for further details of plan.  Discharge Instructions:   Discharge Instructions      To treat the infection in your right ear, please begin taking cefdinir 1 capsule twice daily and be sure you complete a full 10 days.  Complete treatment can result in recurrent infection which could be significantly worse than the infection that you have at this time.  Please do not attempt to treat infections at home with inappropriate antibiotics.  Taking amoxicillin may actually have contributed to the worsening infection in your ear canal.  Please also begin using Ciprodex eardrops, place 4 drops into your right ear twice daily for the next 7 days.  Please return to see us in 5 days so that we can reevaluate your ear to see if we can remove any of the purulent  material in your right ear for you.  After 3 days of taking cefdinir and using Ciprodex drops, if you continue to have significant pain in your right ear or your pain becomes worse, I recommend that you go to the emergency room for further evaluation.  You may require imaging of your head and stronger antibiotics than we can provide for you here at urgent care.  I have sent a prescription for ibuprofen 800 mg that you can take 3 times daily as needed for pain.  You can also apply ice to your ear to soothe and reduce inflammation as well.  Thank you for visiting urgent care today.      This office note has been dictated using [employer] laboratory [employer] speech recognition software.  Unfortunately, and despite my best efforts, this method of dictation can sometimes lead to occasional typographical or grammatical errors.  I apologize in advance if this occurs.     Theadora RamaMorgan, Analiyah Lechuga Scales, PA-C 12/21/21 1718

## 2021-12-26 ENCOUNTER — Encounter: Payer: Self-pay | Admitting: Emergency Medicine

## 2021-12-26 ENCOUNTER — Ambulatory Visit
Admission: EM | Admit: 2021-12-26 | Discharge: 2021-12-26 | Disposition: A | Discharge status: Home / Self Care | Payer: 59 | Attending: Emergency Medicine | Admitting: Emergency Medicine

## 2021-12-26 DIAGNOSIS — H60501 Unspecified acute noninfective otitis externa, right ear: Secondary | ICD-10-CM

## 2021-12-26 MED ORDER — FLUOCINOLONE ACETONIDE 0.01 % OT OIL: 5.0000 [drp] | TOPICAL_OIL | Freq: Two times a day (BID) | OTIC | 0 refills | Status: DC | PRN

## 2021-12-26 MED ORDER — CETIRIZINE HCL 10 MG PO TABS: 10.0000 mg | ORAL_TABLET | Freq: Every day | ORAL | 1 refills | Status: DC

## 2021-12-26 MED ORDER — CIPROFLOXACIN-DEXAMETHASONE 0.3-0.1 % OT SUSP: 4.0000 [drp] | Freq: Two times a day (BID) | OTIC | 0 refills | Status: DC

## 2021-12-26 NOTE — Discharge Instructions (Addendum)
Please stop ciprofloxacin drops at this time.  Please begin Ciprodex drops, place 4 drops into your right ear in the morning and again in the evening for the next 7 days.  Once you have completed Ciprodex, please begin fluocinolone acetonide drops, placed 2 to 3 drops in both ears to 3 times a week to keep your ear canals calm.  I also recommend that you begin taking Zyrtec, 1 tablet daily for possible environmental allergies that are causing your ears to flareup with eczema.  If your insurance will not cover your allergy medications, please consider downloading the [employer] app which is free.    Thank you again for visiting urgent care today.  I enjoyed participating again in your care.

## 2021-12-26 NOTE — ED Triage Notes (Signed)
Pt here with right ear fullness after taking ear drops for infection and is requesting [place] to clean out her ear if possible.

## 2021-12-26 NOTE — ED Provider Notes (Signed)
[facility]    CSN: [id number] Arrival date & time: 12/26/21  1213    HISTORY   Chief Complaint  Patient presents with   Ear Fullness   HPI Ronnee Hinzman is a 51 y.o. female. Patient returns to urgent care 5 days after being prescribed ciprofloxacin otic drops for acute bacterial otitis externa.  Patient is asking to have the contents of the infection cleared from her ear.  Patient states she is no longer having any pain in her ear and states that yesterday she finally felt her ear open up and her hearing return to normal.  The history is provided by the patient. Language interpreter used: Daughter provided intermittent interpretation, for the most part patient spoke [place] well.   Past Medical History:  Diagnosis Date   Carpal tunnel syndrome    Patient Active Problem List   Diagnosis Date Noted   Carpal tunnel syndrome, left upper limb    Post-operative infection right achilles 03/15/2016   Past Surgical History:  Procedure Laterality Date   ACHILLES TENDON REPAIR     CARPAL TUNNEL RELEASE Bilateral    CARPAL TUNNEL RELEASE Left 04/22/2021   Procedure: LEFT CARPAL TUNNEL RELEASE;  Surgeon: Meredith Pel, MD;  Location: [place];  Service: Orthopedics;  Laterality: Left;   EYE SURGERY Bilateral    FEMUR SURGERY Left    HERNIA REPAIR     OB History   No obstetric history on file.    Home Medications    Prior to Admission medications   Medication Sig Start Date End Date Taking? Authorizing Provider  aspirin 325 MG EC tablet Take 325 mg by mouth daily as needed for pain. Reported on 01/08/2016    [provider]  cefdinir (OMNICEF) 300 MG capsule Take 1 capsule (300 mg total) by mouth 2 (two) times daily for 10 days. 12/21/21 12/31/21  Lynden Oxford Scales, PA-C  Ciprofloxacin HCl 0.2 % otic solution Place 0.2 mLs into the right ear 2 (two) times daily for 7 days. 12/21/21 12/28/21  Lynden Oxford Scales, PA-C   FLUoxetine HCl (PROZAC PO) Take by mouth.    [provider]  GABAPENTIN PO Take by mouth.    [provider]  ibuprofen (ADVIL) 800 MG tablet Take 1 tablet (800 mg total) by mouth every 8 (eight) hours as needed for up to 14 doses. 12/21/21   Lynden Oxford Scales, PA-C  methocarbamol (ROBAXIN) 500 MG tablet Take 500 mg by mouth every 8 (eight) hours as needed for muscle spasms.    [provider]  Multiple Vitamin (MULTIVITAMIN PO) Take by mouth.    [provider]   Family History History reviewed. No pertinent family history. Social History Social History   Tobacco Use   Smoking status: Never   Smokeless tobacco: Never  Substance Use Topics   Alcohol use: No   Drug use: No   Allergies   Patient has no known allergies.  Review of Systems Review of Systems Pertinent findings noted in history of present illness.   Physical Exam Triage Vital Signs ED Triage Vitals  Enc Vitals Group     BP 05/07/21 0827 (!) 147/82     Pulse Rate 05/07/21 0827 72     Resp 05/07/21 0827 18     Temp 05/07/21 0827 98.3 F (36.8 C)     Temp Source 05/07/21 0827 Oral     SpO2 05/07/21 0827 98 %     Weight --  Height --      Head Circumference --      Peak Flow --      Pain Score 05/07/21 0826 5     Pain Loc --      Pain Edu? --      Excl. in GC? --   No data found.  Updated Vital Signs BP 123/70   Pulse 70   Temp 98.7 F (37.1 C)   Resp 20   SpO2 98%   Physical Exam Vitals and nursing note reviewed.  Constitutional:      General: She is not in acute distress.    Appearance: Normal appearance. She is well-developed, well-groomed and normal weight. She is not ill-appearing.  HENT:     Head: Normocephalic and atraumatic.     Jaw: No tenderness.     Salivary Glands: Right salivary gland is not diffusely enlarged or tender. Left salivary gland is not diffusely enlarged or tender.     Right Ear: Hearing and external ear normal. No drainage.  No middle ear effusion. There is no impacted cerumen. Tympanic membrane is not injected, erythematous, retracted or bulging.     Left Ear: Hearing, tympanic membrane, ear canal and external ear normal. No drainage.  No middle ear effusion. There is no impacted cerumen. Tympanic membrane is not erythematous or bulging.     Ears:     Comments: Right EAC is eczematous    Nose: Mucosal edema and rhinorrhea present. No nasal deformity, septal deviation or congestion. Rhinorrhea is clear.     Right Turbinates: Not enlarged, swollen or pale.     Left Turbinates: Not enlarged, swollen or pale.     Right Sinus: No maxillary sinus tenderness or frontal sinus tenderness.     Left Sinus: No maxillary sinus tenderness or frontal sinus tenderness.     Mouth/Throat:     Lips: Pink. No lesions.     Mouth: Mucous membranes are moist. No oral lesions.     Pharynx: Oropharynx is clear. Uvula midline. No posterior oropharyngeal erythema or uvula swelling.     Tonsils: No tonsillar exudate. 0 on the right. 0 on the left.  Eyes:     General: Lids are normal.        Right eye: No discharge.        Left eye: No discharge.     Extraocular Movements: Extraocular movements intact.     Conjunctiva/sclera: Conjunctivae normal.     Right eye: Right conjunctiva is not injected.     Left eye: Left conjunctiva is not injected.  Neck:     Trachea: Trachea and phonation normal.  Cardiovascular:     Rate and Rhythm: Normal rate and regular rhythm.     Pulses: Normal pulses.     Heart sounds: Normal heart sounds. No murmur heard.    No friction rub. No gallop.  Pulmonary:     Effort: Pulmonary effort is normal. No accessory muscle usage, prolonged expiration or respiratory distress.     Breath sounds: Normal breath sounds. No stridor, decreased air movement or transmitted upper airway sounds. No decreased breath sounds, wheezing, rhonchi or rales.  Chest:     Chest wall: No tenderness.  Musculoskeletal:         General: Normal range of motion.     Cervical back: Normal range of motion and neck supple. Normal range of motion.  Lymphadenopathy:     Cervical: No cervical adenopathy.  Skin:    General: Skin is warm  and dry.     Findings: No erythema or rash.  Neurological:     General: No focal deficit present.     Mental Status: She is alert and oriented to person, place, and time.  Psychiatric:        Mood and Affect: Mood normal.        Behavior: Behavior normal. Behavior is cooperative.     Visual Acuity Right Eye Distance:   Left Eye Distance:   Bilateral Distance:    Right Eye Near:   Left Eye Near:    Bilateral Near:     [facility] Couse / Diagnostics / Procedures:    EKG  Radiology No results found.  Procedures Ear Cerumen Removal  Date/Time: 12/26/2021 1:12 PM  Performed by: Frederic Jericho, RN Authorized by: Theadora Rama Scales, PA-C   Consent:    Consent obtained:  Verbal   Consent given by:  Patient   Risks, benefits, and alternatives were discussed: yes     Risks discussed:  Bleeding, dizziness, infection, incomplete removal, pain and TM perforation   Alternatives discussed:  No treatment, delayed treatment, alternative treatment, observation and referral Universal protocol:    Procedure explained and questions answered to patient or proxy's satisfaction: yes     Patient identity confirmed:  Verbally with patient and arm band Procedure details:    Location:  R ear   Procedure type: irrigation     Procedure outcomes: cerumen removed   Post-procedure details:    Inspection:  No bleeding, ear canal clear and TM intact   Hearing quality:  Normal   Procedure completion:  Tolerated  (including critical care time)  UC Diagnoses / Final Clinical Impressions(s)   I have reviewed the triage vital signs and the nursing notes.  Pertinent labs & imaging results that were available during my care of the patient were reviewed by me and considered in my medical decision  making (see chart for details).   Final diagnoses:  Acute non-infective otitis externa of right ear, unspecified type   Patient advised to stop Ciloxan and begin Ciprodex for 1 week.  Patient provided with fluocinolone to continue using after completing Ciprodex.  Return precautions advised.  ED Prescriptions     Medication Sig Dispense Auth. Provider   ciprofloxacin-dexamethasone (CIPRODEX) OTIC suspension Place 4 drops into the right ear 2 (two) times daily. X 7 days 7.5 mL Theadora Rama Scales, PA-C   Fluocinolone Acetonide (DERMOTIC) 0.01 % OIL Place 5 drops in ear(s) 2 (two) times daily as needed (Itching in ears). Begin after using Ciprodex 20 mL Theadora Rama Scales, PA-C   cetirizine (ZYRTEC ALLERGY) 10 MG tablet Take 1 tablet (10 mg total) by mouth at bedtime. 90 tablet Theadora Rama Scales, [place]      PDMP not reviewed this encounter.  Pending results:  Labs Reviewed - No data to display  Medications Ordered in UC: Medications - No data to display  Disposition Upon Discharge:  Condition: stable for discharge home Home: take medications as prescribed; routine discharge instructions as discussed; follow up as advised.  Patient presented with an acute illness with associated systemic symptoms and significant discomfort requiring urgent management. In my opinion, this is a condition that a prudent lay person (someone who possesses an average knowledge of health and medicine) may potentially expect to result in complications if not addressed urgently such as respiratory distress, impairment of bodily function or dysfunction of bodily organs.   Routine symptom specific, illness specific and/or disease specific instructions  were discussed with the patient and/or caregiver at length.   As such, the patient has been evaluated and assessed, work-up was performed and treatment was provided in alignment with urgent care protocols and evidence based medicine.   Patient/parent/caregiver has been advised that the patient may require follow up for further testing and treatment if the symptoms continue in spite of treatment, as clinically indicated and appropriate.  If the patient was tested for COVID-19, Influenza and/or RSV, then the patient/parent/guardian was advised to isolate at home pending the results of his/her diagnostic coronavirus test and potentially longer if they're positive. I have also advised pt that if his/her COVID-19 test returns positive, it's recommended to self-isolate for at least 10 days after symptoms first appeared AND until fever-free for 24 hours without fever reducer AND other symptoms have improved or resolved. Discussed self-isolation recommendations as well as instructions for household member/close contacts as per the [facility] and [place] [facility], and also gave patient the [place] packet with this information.  Patient/parent/caregiver has been advised to return to the [facility] or PCP in 3-5 days if no better; to PCP or the Emergency Department if new signs and symptoms develop, or if the current signs or symptoms continue to change or worsen for further workup, evaluation and treatment as clinically indicated and appropriate  The patient will follow up with their current PCP if and as advised. If the patient does not currently have a PCP we will assist them in obtaining one.   The patient may need specialty follow up if the symptoms continue, in spite of conservative treatment and management, for further workup, evaluation, consultation and treatment as clinically indicated and appropriate.  Patient/parent/caregiver verbalized understanding and agreement of plan as discussed.  All questions were addressed during visit.  Please see discharge instructions below for further details of plan.  Discharge Instructions:   Discharge Instructions      Please stop ciprofloxacin drops at this time.  Please begin Ciprodex drops, place 4 drops into your  right ear in the morning and again in the evening for the next 7 days.  Once you have completed Ciprodex, please begin fluocinolone acetonide drops, placed 2 to 3 drops in both ears to 3 times a week to keep your ear canals calm.  I also recommend that you begin taking Zyrtec, 1 tablet daily for possible environmental allergies that are causing your ears to flareup with eczema.  If your insurance will not cover your allergy medications, please consider downloading the [employer] app which is free.    Thank you again for visiting urgent care today.  I enjoyed participating again in your care.      This office note has been dictated using Museum/gallery curator.  Unfortunately, and despite my best efforts, this method of dictation can sometimes lead to occasional typographical or grammatical errors.  I apologize in advance if this occurs.     Lynden Oxford Scales, PA-C 12/27/21 1112

## 2021-12-28 ENCOUNTER — Encounter: Payer: Self-pay | Admitting: Emergency Medicine

## 2021-12-28 ENCOUNTER — Ambulatory Visit (INDEPENDENT_AMBULATORY_CARE_PROVIDER_SITE_OTHER): Payer: 59 | Admitting: Emergency Medicine

## 2021-12-28 VITALS — BP 116/80 | HR 75 | Temp 98.5°F | Wt 132.4 lb

## 2021-12-28 DIAGNOSIS — Z1329 Encounter for screening for other suspected endocrine disorder: Secondary | ICD-10-CM | POA: Diagnosis not present

## 2021-12-28 DIAGNOSIS — Z1211 Encounter for screening for malignant neoplasm of colon: Secondary | ICD-10-CM | POA: Diagnosis not present

## 2021-12-28 DIAGNOSIS — M654 Radial styloid tenosynovitis [de Quervain]: Secondary | ICD-10-CM | POA: Diagnosis not present

## 2021-12-28 DIAGNOSIS — Z130 Encounter for screening for diseases of the blood and blood-forming organs and certain disorders involving the immune mechanism: Secondary | ICD-10-CM | POA: Diagnosis not present

## 2021-12-28 DIAGNOSIS — Z0001 Encounter for general adult medical examination with abnormal findings: Secondary | ICD-10-CM

## 2021-12-28 DIAGNOSIS — H9201 Otalgia, right ear: Secondary | ICD-10-CM | POA: Diagnosis not present

## 2021-12-28 DIAGNOSIS — Z13220 Encounter for screening for lipoid disorders: Secondary | ICD-10-CM | POA: Diagnosis not present

## 2021-12-28 DIAGNOSIS — Z114 Encounter for screening for human immunodeficiency virus [HIV]: Secondary | ICD-10-CM

## 2021-12-28 DIAGNOSIS — Z7689 Persons encountering health services in other specified circumstances: Secondary | ICD-10-CM

## 2021-12-28 DIAGNOSIS — Z1159 Encounter for screening for other viral diseases: Secondary | ICD-10-CM | POA: Diagnosis not present

## 2021-12-28 DIAGNOSIS — Z13228 Encounter for screening for other metabolic disorders: Secondary | ICD-10-CM | POA: Diagnosis not present

## 2021-12-28 DIAGNOSIS — R69 Illness, unspecified: Secondary | ICD-10-CM | POA: Diagnosis not present

## 2021-12-28 DIAGNOSIS — Z1231 Encounter for screening mammogram for malignant neoplasm of breast: Secondary | ICD-10-CM | POA: Diagnosis not present

## 2021-12-28 LAB — CBC WITH DIFFERENTIAL/PLATELET
Basophils Absolute: 0 10*3/uL (ref 0.0–0.1)
Basophils Relative: 0.6 % (ref 0.0–3.0)
Eosinophils Absolute: 0.1 10*3/uL (ref 0.0–0.7)
Eosinophils Relative: 0.9 % (ref 0.0–5.0)
HCT: 38.9 % (ref 36.0–46.0)
Hemoglobin: 12.8 g/dL (ref 12.0–15.0)
Lymphocytes Relative: 26.7 % (ref 12.0–46.0)
Lymphs Abs: 1.9 10*3/uL (ref 0.7–4.0)
MCHC: 32.8 g/dL (ref 30.0–36.0)
MCV: 80.7 fl (ref 78.0–100.0)
Monocytes Absolute: 0.4 10*3/uL (ref 0.1–1.0)
Monocytes Relative: 5.8 % (ref 3.0–12.0)
Neutro Abs: 4.7 10*3/uL (ref 1.4–7.7)
Neutrophils Relative %: 66 % (ref 43.0–77.0)
Platelets: 261 10*3/uL (ref 150.0–400.0)
RBC: 4.82 Mil/uL (ref 3.87–5.11)
RDW: 14.6 % (ref 11.5–15.5)
WBC: 7.1 10*3/uL (ref 4.0–10.5)

## 2021-12-28 LAB — COMPREHENSIVE METABOLIC PANEL
ALT: 18 U/L (ref 0–35)
AST: 17 U/L (ref 0–37)
Albumin: 4.3 g/dL (ref 3.5–5.2)
Alkaline Phosphatase: 91 U/L (ref 39–117)
BUN: 10 mg/dL (ref 6–23)
CO2: 30 mEq/L (ref 19–32)
Calcium: 9.7 mg/dL (ref 8.4–10.5)
Chloride: 101 mEq/L (ref 96–112)
Creatinine, Ser: 0.76 mg/dL (ref 0.40–1.20)
GFR: 90.19 mL/min (ref >60.00)
Glucose, Bld: 83 mg/dL (ref 70–99)
Potassium: 3.4 mEq/L — ABNORMAL LOW (ref 3.5–5.1)
Sodium: 138 mEq/L (ref 135–145)
Total Bilirubin: 0.8 mg/dL (ref 0.2–1.2)
Total Protein: 7.8 g/dL (ref 6.0–8.3)

## 2021-12-28 LAB — LIPID PANEL
Cholesterol: 219 mg/dL — ABNORMAL HIGH (ref 0–200)
HDL: 81 mg/dL (ref >39.00)
LDL Cholesterol: 124 mg/dL — ABNORMAL HIGH (ref 0–99)
NonHDL: 137.98
Total CHOL/HDL Ratio: 3
Triglycerides: 72 mg/dL (ref 0.0–149.0)
VLDL: 14.4 mg/dL (ref 0.0–40.0)

## 2021-12-28 LAB — HEMOGLOBIN A1C: Hgb A1c MFr Bld: 5.9 % (ref 4.6–6.5)

## 2021-12-28 LAB — TSH: TSH: 0.83 u[IU]/mL (ref 0.35–5.50)

## 2021-12-28 NOTE — Patient Instructions (Signed)

## 2021-12-28 NOTE — Progress Notes (Signed)
Toni Hall 51 y.o.   Chief Complaint  Patient presents with   New Patient (Initial Visit)    Pt states she is not taking any medications at the time Pt unsure if wants shingrix vaccine     HISTORY OF PRESENT ILLNESS: This is a 51 y.o. female first visit to this office, here to establish care with me. Requesting physical. Originally from [place]. No chronic medical problems. Healthy lifestyle. No chronic medications.  HPI   Prior to Admission medications   Medication Sig Start Date End Date Taking? Authorizing Provider  ciprofloxacin-dexamethasone (CIPRODEX) OTIC suspension Place 4 drops into the right ear 2 (two) times daily. X 7 days 12/26/21  Yes Theadora Rama Scales, PA-C  aspirin 325 MG EC tablet Take 325 mg by mouth daily as needed for pain. Reported on 01/08/2016 Patient not taking: Reported on 12/28/2021    [provider]  cefdinir (OMNICEF) 300 MG capsule Take 1 capsule (300 mg total) by mouth 2 (two) times daily for 10 days. Patient not taking: Reported on 12/28/2021 12/21/21 12/31/21  Theadora Rama Scales, PA-C  cetirizine (ZYRTEC ALLERGY) 10 MG tablet Take 1 tablet (10 mg total) by mouth at bedtime. Patient not taking: Reported on 12/28/2021 12/26/21 06/24/22  Theadora Rama Scales, PA-C  Fluocinolone Acetonide (DERMOTIC) 0.01 % OIL Place 5 drops in ear(s) 2 (two) times daily as needed (Itching in ears). Begin after using Ciprodex Patient not taking: Reported on 12/28/2021 12/26/21   Theadora Rama Scales, PA-C  FLUoxetine HCl (PROZAC PO) Take by mouth. Patient not taking: Reported on 12/28/2021    [provider]  GABAPENTIN PO Take by mouth. Patient not taking: Reported on 12/28/2021    [provider]  ibuprofen (ADVIL) 800 MG tablet Take 1 tablet (800 mg total) by mouth every 8 (eight) hours as needed for up to 14 doses. Patient not taking: Reported on 12/28/2021 12/21/21   Theadora Rama Scales, PA-C  methocarbamol (ROBAXIN) 500  MG tablet Take 500 mg by mouth every 8 (eight) hours as needed for muscle spasms. Patient not taking: Reported on 12/28/2021    [provider]  Multiple Vitamin (MULTIVITAMIN PO) Take by mouth. Patient not taking: Reported on 12/28/2021    [provider]    No Known Allergies  Patient Active Problem List   Diagnosis Date Noted   Carpal tunnel syndrome, left upper limb    Post-operative infection right achilles 03/15/2016    Past Medical History:  Diagnosis Date   Carpal tunnel syndrome     Past Surgical History:  Procedure Laterality Date   ACHILLES TENDON REPAIR     CARPAL TUNNEL RELEASE Bilateral    CARPAL TUNNEL RELEASE Left 04/22/2021   Procedure: LEFT CARPAL TUNNEL RELEASE;  Surgeon: Cammy Copa, MD;  Location: [facility];  Service: Orthopedics;  Laterality: Left;   EYE SURGERY Bilateral    FEMUR SURGERY Left    HERNIA REPAIR      Social History   Socioeconomic History   Marital status: Single    Spouse name: Not on file   Number of children: Not on file   Years of education: Not on file   Highest education level: Not on file  Occupational History   Not on file  Tobacco Use   Smoking status: Never   Smokeless tobacco: Never  Substance and Sexual Activity   Alcohol use: No   Drug use: No   Sexual activity: Not on file  Other Topics Concern  Not on file  Social History Narrative   Not on file   Social Determinants of Health   Financial Resource Strain: Not on file  Food Insecurity: Not on file  Transportation Needs: Not on file  Physical Activity: Not on file  Stress: Not on file  Social Connections: Not on file  Intimate Partner Violence: Not on file    History reviewed. No pertinent family history.   Review of Systems  Constitutional: Negative.  Negative for chills and fever.  HENT:  Positive for ear pain (Chronic right ear pain).   Respiratory: Negative.  Negative for cough and hemoptysis.    Cardiovascular: Negative.  Negative for chest pain and palpitations.  Gastrointestinal: Negative.  Negative for abdominal pain, diarrhea, nausea and vomiting.  Genitourinary: Negative.  Negative for dysuria and hematuria.  Musculoskeletal:        Chronic pain to right thumb area  Skin: Negative.  Negative for rash.  Neurological:  Negative for dizziness and headaches.  All other systems reviewed and are negative.  Today's Vitals   12/28/21 1522  BP: 116/80  Pulse: 75  Temp: 98.5 F (36.9 C)  TempSrc: Oral  SpO2: 97%  Weight: 132 lb 6 oz (60 kg)   Body mass index is 24.21 kg/m. Wt Readings from Last 3 Encounters:  12/28/21 132 lb 6 oz (60 kg)  04/22/21 127 lb 10.3 oz (57.9 kg)  03/15/16 138 lb 14.2 oz (63 kg)     Physical Exam Vitals reviewed.  Constitutional:      Appearance: Normal appearance.  HENT:     Head: Normocephalic and atraumatic.     Right Ear: Tympanic membrane, ear canal and external ear normal.     Left Ear: Tympanic membrane, ear canal and external ear normal.     Mouth/Throat:     Mouth: Mucous membranes are moist.     Pharynx: Oropharynx is clear.  Cardiovascular:     Rate and Rhythm: Normal rate and regular rhythm.     Pulses: Normal pulses.     Heart sounds: Normal heart sounds.  Pulmonary:     Effort: Pulmonary effort is normal.     Breath sounds: Normal breath sounds.  Abdominal:     Palpations: Abdomen is soft. There is no mass.     Tenderness: There is no abdominal tenderness.  Musculoskeletal:     Cervical back: No tenderness.     Comments: Right thumb: Positive tenderness along extensor tendon compatible with tenosynovitis  Lymphadenopathy:     Cervical: No cervical adenopathy.  Skin:    General: Skin is warm and dry.     Capillary Refill: Capillary refill takes less than 2 seconds.  Neurological:     General: No focal deficit present.     Mental Status: She is alert and oriented to person, place, and time.  Psychiatric:         Mood and Affect: Mood normal.        Behavior: Behavior normal.      ASSESSMENT & PLAN: Problem List Items Addressed This Visit   None Visit Diagnoses     Encounter for general adult medical examination with abnormal findings    -  Primary   De Quervain's tenosynovitis, right       Relevant Orders   Ambulatory referral to Sports Medicine   Otalgia, right  (Chronic)      Relevant Orders   Ambulatory referral to ENT   Encounter to establish care  Need for hepatitis C screening test       Relevant Orders   Hepatitis C antibody screen   Screening for HIV (human immunodeficiency virus)       Relevant Orders   HIV antibody   Visit for screening mammogram       Relevant Orders   Mammogram Digital Screening   Colon cancer screening       Relevant Orders   Ambulatory referral to Gastroenterology   Screening for deficiency anemia       Relevant Orders   CBC with Differential   Screening for lipoid disorders       Relevant Orders   Lipid panel   Screening for endocrine, metabolic and immunity disorder       Relevant Orders   Comprehensive metabolic panel   Hemoglobin A1c   TSH      Modifiable risk factors discussed with patient. Anticipatory guidance according to age provided. The following topics were also discussed: Social Determinants of Health Smoking.  Non-smoker Diet and nutrition.  Good eating habits Benefits of exercise.  Stays physically active Cancer screening and need for breast and colon cancer screening with mammogram and colonoscopy Vaccinations recommendations Cardiovascular risk assessment and need for blood work Need for ENT evaluation for right chronic ear problems Need for sports medicine evaluation for de Quervain tenosynovitis Mental health including depression and anxiety Fall and accident prevention  Patient Instructions  Mantenimiento de Radiographer, therapeuticla salud en las mujeres Health Maintenance, Female Adoptar un estilo de vida saludable y recibir  atencin preventiva son importantes para promover la salud y Counsellorel bienestar. Consulte al mdico sobre: El esquema adecuado para hacerse pruebas y exmenes peridicos. Cosas que puede hacer por su cuenta para prevenir enfermedades y [place]mantenerse sano. Qu debo saber sobre la dieta, el peso y el ejercicio? Consuma una dieta saludable  Consuma una dieta que incluya muchas verduras, frutas, productos lcteos con bajo contenido de [place]grasa y Associate Professorprotenas magras. No consuma muchos alimentos ricos en grasas slidas, azcares agregados o sodio. Mantenga un peso saludable El ndice de masa muscular [facility]) se [place] ([place]) Islandsutiliza para identificar problemas de [place]peso. Proporciona una estimacin de la grasa corporal basndose en el peso y la altura. Su mdico puede ayudarle a Engineer, sitedeterminar su IMC y a Personnel officerlograr o Pharmacologistmantener un peso saludable. Haga ejercicio con regularidad Haga ejercicio con regularidad. Esta es una de las prcticas ms importantes que puede hacer por su salud. La Harley-Davidsonmayora de los adultos deben seguir estas pautas: Education officer, environmentalealizar, al menos, 150 minutos de actividad fsica por semana. El ejercicio debe aumentar la frecuencia cardaca y Media plannerhacerlo transpirar (ejercicio de intensidad moderada). Hacer ejercicios de fortalecimiento por lo Rite Aidmenos dos veces por semana. Agregue esto a su plan de ejercicio de intensidad moderada. Pase menos tiempo sentada. Incluso la actividad fsica ligera puede ser beneficiosa. Controle sus niveles de colesterol y lpidos en la sangre Comience a realizarse anlisis de lpidos y Oncologistcolesterol en la sangre a los 20 aos y luego reptalos cada 5 aos. Hgase controlar los niveles de colesterol con mayor frecuencia si: Sus niveles de lpidos y colesterol son altos. Es mayor de 40 aos. Presenta un alto riesgo de padecer enfermedades cardacas. Qu debo saber sobre las pruebas de deteccin del cncer? Segn su historia clnica y sus antecedentes familiares, es posible que deba realizarse pruebas de deteccin del  cncer en diferentes edades. Esto puede incluir pruebas de deteccin de lo siguiente: Cncer de mama. Cncer de cuello uterino. Cncer colorrectal. Cncer de piel. Cncer de pulmn. Ladell HeadsQu  debo saber sobre la enfermedad cardaca, la diabetes y la hipertensin arterial? Presin arterial y enfermedad cardaca La hipertensin arterial causa enfermedades cardacas y [place] el riesgo de accidente cerebrovascular. Es ms probable que esto se manifieste en las personas que tienen lecturas de presin arterial alta o tienen sobrepeso. Hgase controlar la presin arterial: Cada 3 a 5 aos si tiene entre 18 y 74 aos. Todos los aos si es mayor de 40 aos. Diabetes Realcese exmenes de deteccin de la diabetes con regularidad. Este anlisis revisa el nivel de azcar en la sangre en [place]. Hgase las pruebas de deteccin: Cada tres aos despus de los 40 aos de edad si tiene un peso normal y un bajo riesgo de padecer diabetes. Con ms frecuencia y a partir de [employer] edad inferior si tiene sobrepeso o un alto riesgo de padecer diabetes. Qu debo saber sobre la prevencin de infecciones? Hepatitis B Si tiene un riesgo ms alto de contraer hepatitis B, debe someterse a un examen de deteccin de este virus. Hable con el mdico para averiguar si tiene riesgo de contraer la infeccin por hepatitis B. Hepatitis C Se recomienda el anlisis a: [employer] 1945 y 1965. Todas las personas que tengan un riesgo de haber contrado hepatitis C. Enfermedades de transmisin sexual (ETS) Hgase las pruebas de Airline pilot de ITS, incluidas la gonorrea y la clamidia, si: Es sexualmente activa y es menor de [address]. Es mayor de [address], y Public affairs consultant informa que corre riesgo de tener este tipo de infecciones. La actividad sexual ha cambiado desde que le hicieron la ltima prueba de deteccin y tiene un riesgo mayor de Warehouse manager clamidia o Copy. Pregntele al mdico si usted tiene riesgo. Pregntele al  mdico si usted tiene un alto riesgo de Primary school teacher VIH. El mdico tambin puede recomendarle un medicamento recetado para ayudar a evitar la infeccin por el VIH. Si elige tomar medicamentos para prevenir el VIH, primero debe [employer] de deteccin del VIH. Luego debe hacerse anlisis cada 3 meses mientras est tomando los medicamentos. Embarazo Si est por dejar de Armed forces training and education officer (fase premenopusica) y usted puede quedar [place], busque asesoramiento antes de [place]. Tome de 400 a 800 microgramos (mcg) de cido [employer] si [place]. Pida mtodos de control de la natalidad (anticonceptivos) si desea evitar un embarazo no deseado. Osteoporosis y [place] La osteoporosis es una enfermedad en la que los huesos pierden los minerales y la fuerza por el avance de la edad. El resultado pueden ser fracturas en los [place]. Si tiene 65 aos o ms, o si est en riesgo de sufrir osteoporosis y fracturas, pregunte a su mdico si debe: Hacerse pruebas de deteccin de prdida sea. Tomar un suplemento de calcio o de vitamina D para reducir el riesgo de fracturas. Recibir terapia de reemplazo hormonal (TRH) para tratar los sntomas de la menopausia. Siga estas indicaciones en su casa: Consumo de alcohol No beba alcohol si: Su mdico le indica no hacerlo. Est embarazada, puede estar embarazada o est tratando de [place]. Si bebe alcohol: Limite la cantidad que bebe a lo siguiente: De 0 a 1 bebida por da. Sepa cunta cantidad de alcohol hay en las bebidas que toma. En los [address], una medida equivale a una botella de cerveza de 12 oz (355 ml), un vaso de vino de 5 oz (148 ml) o un vaso de una bebida alcohlica de alta graduacin de 1 oz (44 ml). Estilo de vida No  consuma ningn producto que contenga nicotina o tabaco. Estos productos incluyen cigarrillos, tabaco para Theatre manager y aparatos de vapeo, como los Administrator, Civil Service. Si necesita ayuda para  dejar de consumir estos productos, consulte al mdico. No consuma drogas. No comparta agujas. Solicite ayuda a su mdico si necesita apoyo o informacin para abandonar las drogas. Indicaciones generales Realcese los estudios de rutina de [address], dentales y de Wellsite geologist. Mantngase al da con las vacunas. Infrmele a su mdico si: Se siente deprimida con frecuencia. Alguna vez ha sido vctima de [place] o no se siente seguro en su casa. Resumen Adoptar un estilo de vida saludable y recibir atencin preventiva son importantes para promover la salud y Counsellor. Siga las instrucciones del mdico acerca de una dieta saludable, el ejercicio y la realizacin de pruebas o exmenes para Hotel manager. Siga las instrucciones del mdico con respecto al control del colesterol y la presin arterial. Esta informacin no tiene Theme park manager el consejo del mdico. Asegrese de hacerle al mdico cualquier pregunta que tenga. Document Revised: 12/03/2020 Document Reviewed: 12/03/2020 Elsevier Patient Education  2023 Elsevier Inc.     Edwina Barth, MD [facility]

## 2021-12-29 LAB — HIV ANTIBODY (ROUTINE TESTING W REFLEX): HIV 1&2 Ab, 4th Generation: NONREACTIVE

## 2021-12-29 LAB — HEPATITIS C ANTIBODY: Hepatitis C Ab: NONREACTIVE

## 2022-01-07 ENCOUNTER — Ambulatory Visit: Payer: Self-pay

## 2022-01-07 ENCOUNTER — Ambulatory Visit: Payer: 59 | Admitting: Family Medicine

## 2022-01-07 VITALS — BP 130/86 | HR 69 | Ht 62.0 in | Wt 132.6 lb

## 2022-01-07 DIAGNOSIS — G8929 Other chronic pain: Secondary | ICD-10-CM | POA: Diagnosis not present

## 2022-01-07 DIAGNOSIS — M25531 Pain in right wrist: Secondary | ICD-10-CM

## 2022-01-07 DIAGNOSIS — M654 Radial styloid tenosynovitis [de Quervain]: Secondary | ICD-10-CM

## 2022-01-07 MED ORDER — PREDNISONE 50 MG PO TABS: 50.0000 mg | ORAL_TABLET | Freq: Every day | ORAL | 0 refills | Status: DC

## 2022-01-07 MED ORDER — TIZANIDINE HCL 4 MG PO TABS: 4.0000 mg | ORAL_TABLET | Freq: Four times a day (QID) | ORAL | 1 refills | Status: AC | PRN

## 2022-01-07 NOTE — Progress Notes (Signed)
   I, Philbert Riser, LAT, ATC acting as a scribe for Clementeen Graham, MD.  Subjective:    CC: R wrist pain  HPI: Pt is a 51 y/o female c/o R wrist pain x 2 months. 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 Pt locates pain to along the radial aspect of the R wrist, into the R thumb, and proximally along the radial aspect of the R forearm.  Grip strength: diminished- dropping everything R wrist swelling: yes Numbness/tingling: no Aggravates: any use Treatments tried: thumb brace, IBU, creams  Dx imaging: 03/31/21 L wrist XR  Pertinent review of Systems: No fevers or chills  Relevant historical information: Carpal tunnel syndrome   Objective:    Vitals:   01/07/22 1027  BP: 130/86  Pulse: 69  SpO2: 99%   General: Well Developed, well nourished, and in no acute distress.   MSK: Right wrist normal. Tender palpation at radial styloid right wrist. Decreased wrist motion especially to ulnar deviation and radial deviation. Positive Finkelstein's test. Intact strength.  Lab and Radiology Results  Diagnostic Limited MSK Ultrasound of: Right wrist Hypoechoic fluid surrounds tendon and first dorsal wrist compartment consistent in appearance with de Quervain's tenosynovitis Impression: De Quervain's tenosynovitis right wrist.    Impression and Recommendations:    Assessment and Plan: 51 y.o. female with Corvin tenosynovitis right wrist.  Plan to treat with prednisone and thumb spica splint.  Ideally we would be proceeding with an injection today however she would like to avoid injections which is reasonable.  Prednisone is a reasonable alternative.  Also recommend Voltaren gel.  She asked for a muscle relaxer to help her sleep at night with this pain.  I am not convinced this will help very much but it is again not unreasonable.  Tizanidine at bedtime as reasonable. Work note provided.  If not improving recheck in a month consider injection.Marland Kitchen  PDMP not  reviewed this encounter. Orders Placed This Encounter  Procedures   [place] LIMITED JOINT SPACE STRUCTURES UP RIGHT(NO LINKED CHARGES)    Order Specific Question:   Reason for Exam (SYMPTOM  OR DIAGNOSIS REQUIRED)    Answer:   right wrist pain    Order Specific Question:   Preferred imaging location?    Answer:   [facility] ordered this encounter  Medications   predniSONE (DELTASONE) 50 MG tablet    Sig: Take 1 tablet (50 mg total) by mouth daily.    Dispense:  5 tablet    Refill:  0   tiZANidine (ZANAFLEX) 4 MG tablet    Sig: Take 1 tablet (4 mg total) by mouth every 6 (six) hours as needed for muscle spasms.    Dispense:  30 tablet    Refill:  1    Discussed warning signs or symptoms. Please see discharge instructions. Patient expresses understanding.   The above documentation has been reviewed and is accurate and complete Clementeen Graham, M.D.  Spanish interpreter used for today's visit.

## 2022-01-07 NOTE — Patient Instructions (Addendum)
Thank you for coming in today.   Thumb Spica Wrist brace long  Please go to [facility] supply to get the thumb spica we talked about today. You may also be able to get it from [employer].    Recheck in 1 month if needed.   Tenosinovitis de Lollie Sails De Quervain's Tenosynovitis  La tenosinovitis de Engineer, maintenance es una afeccin que causa la inflamacin del tendn del lado de la mueca en el que se Diplomatic Services operational officer. Los tendones son cordones de tejido que [employer] con los msculos. Los tendones de la mano pasan a travs de un tnel denominado vaina. Una capa laxa de tejido (sinovia) permite que los tendones se muevan suavemente dentro de la vaina. Con la tenosinovitis de [employer], la vaina se inflama o se engrosa, lo que causa friccin y Engineer, mining. La afeccin tambin se conoce como enfermedad de [place] y sndrome de Engineer, maintenance. Ocurre principalmente en las mujeres que tienen 30 y 50 aos. Cules son las causas? Se desconoce la causa exacta de esta afeccin. Puede estar asociada con el uso excesivo de la mano y la [place]. Qu incrementa el riesgo? Es ms probable que tenga esta afeccin si: [place] las manos ms de lo normal, especialmente si repite ciertos movimientos que incluyen torcer la mano o sujetar objetos con mucha fuerza. Est embarazada. Es una mujer de [place]. Tiene artritis reumatoide. Tiene diabetes. Cules son los signos o sntomas? El sntoma principal de esta afeccin es Chief Technology Officer en el lado de la mueca en el que se Diplomatic Services operational officer. El dolor puede empeorar cuando sujeta un objeto o tuerce la [place]. Otros sntomas pueden incluir: Dolor que se extiende Government social research officer. Hinchazn de la [place] y la [place]. Dificultad para Architect y la [place]. Sensacin de crujido [employer]. Una protuberancia llena de lquido (quiste) en la zona del dolor. Cmo se diagnostica? Esta afeccin se puede diagnosticar en funcin de lo siguiente: Los sntomas  y los antecedentes mdicos. Un examen fsico. Durante el examen, el mdico puede hacer una prueba sencilla (maniobra de [place]) que implica jalar el pulgar y la [place] para determinar si esto causa dolor. Es posible que tambin deba realizarse una radiografa o una ecografa. Cmo se trata? El tratamiento de esta afeccin puede incluir lo siguiente: Automotive engineer cualquier actividad que cause dolor e hinchazn. Tomar medicamentos. Los antiinflamatorios y las inyecciones de corticoesteroides pueden reducir la inflamacin y Engineer, materials. Usar una frula. Someterse a [place]. Esto puede ser necesario si otros tratamientos no funcionan. Una vez que el dolor y la hinchazn hayan disminuido, puede comenzar: Fisioterapia. Esto incluye ejercicios para mejorar el movimiento y la fuerza de la [place] y Multimedia programmer. Terapia ocupacional. Esto incluye modificar la forma de mover la [place]. Siga estas instrucciones en su casa: Si tiene una frula: Use la frula como se lo haya indicado el mdico. Qutesela solamente como se lo haya indicado el mdico. Afloje la frula si los dedos se le adormecen, siente hormigueo o se le enfran y se tornan azulados. Mantenga la frula limpia. Si la frula no es impermeable: No deje que se moje. Cbrala con un envoltorio hermtico cuando tome un bao de inmersin o una ducha. Control del dolor, la rigidez y Programmer, multimedia los movimientos y las actividades que causen dolor e hinchazn en la zona de la [place]. Si se lo indican, aplique hielo sobre la zona dolorida. Esto puede ser de [place]  despus de Arts development officer que requieren el uso de la [place] dolorida. Para hacer esto: Ponga el hielo en una bolsa plstica. Coloque una toalla entre la piel y Copy. Aplique el hielo durante 20 minutos, 2 o 3 veces por da. Retire el hielo si la piel se pone de color rojo brillante. Esto es [employer]. Si no puede sentir dolor, calor o fro, tiene un mayor riesgo  de que se dae la zona. Mueva los dedos con frecuencia para reducir la rigidez y la hinchazn. Cuando est sentado o acostado, levante (eleve) la zona lesionada por encima del nivel del corazn. Indicaciones generales [place] sus actividades normales segn lo indicado por el mdico. Pregntele al mdico qu actividades son seguras para usted. Use los medicamentos de venta libre y los recetados solamente como se lo haya indicado el mdico. Cumpla con todas las visitas de seguimiento. Esto es importante. Comunquese con un mdico si: Los medicamentos no ayudan. El dolor [place]. Tiene nuevos sntomas. Resumen La tenosinovitis de Engineer, maintenance es una afeccin que causa la inflamacin del tendn del lado de la mueca en el que se Diplomatic Services operational officer. La afeccin ocurre principalmente en las mujeres que tienen 30 y 50 aos. Se desconoce la causa exacta de esta afeccin. Puede estar asociada con el uso excesivo de la mano y la [place]. El tratamiento comienza con evitar actividades que le causen dolor o hinchazn en la zona de la [place]. Otros tratamientos pueden incluir el uso de una frula y tomar medicamentos. En algunos casos se requiere [place]. Esta informacin no tiene Theme park manager el consejo del mdico. Asegrese de hacerle al mdico cualquier pregunta que tenga. Document Revised: 12/19/2019 Document Reviewed: 12/19/2019 Elsevier Patient Education  2023 [employer].

## 2022-02-04 ENCOUNTER — Ambulatory Visit (INDEPENDENT_AMBULATORY_CARE_PROVIDER_SITE_OTHER): Payer: 59 | Admitting: Family Medicine

## 2022-02-04 ENCOUNTER — Ambulatory Visit: Payer: Self-pay

## 2022-02-04 VITALS — BP 128/86 | HR 81 | Ht 62.0 in | Wt 134.6 lb

## 2022-02-04 DIAGNOSIS — M654 Radial styloid tenosynovitis [de Quervain]: Secondary | ICD-10-CM | POA: Diagnosis not present

## 2022-02-04 MED ORDER — DICLOFENAC SODIUM 75 MG PO TBEC: 75.0000 mg | DELAYED_RELEASE_TABLET | Freq: Two times a day (BID) | ORAL | 1 refills | Status: DC | PRN

## 2022-02-04 MED ORDER — KETOROLAC TROMETHAMINE 60 MG/2ML IM SOLN
60.0000 mg | Freq: Once | INTRAMUSCULAR | Status: AC
Start: 2022-02-04 — End: 2022-02-04
  Administered 2022-02-04: 60 mg via INTRAMUSCULAR

## 2022-02-04 NOTE — Progress Notes (Signed)
   I, Philbert Riser, LAT, ATC acting as a scribe for Toni Graham, MD.  Toni Hall is a 51 y.o. female who presents to [facility] at [facility] today for f/u R wrist pain thought to be due to DeQuervain's tenosynovitis. 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 Pt was last seen by Dr. Denyse Amass on 01/07/22 and was advised to treat with prednisone, Voltaren gel, and thumb spica splint. Pt was also prescribed Tizanidine to help her sleep at night w/ this pain. Today, pt reports R wrist pain is worse. Pt has been compliant in wearing her thumb spica wrist brace. Pt notes R wrist pain is horrible.   She is significantly opposed to the idea of a 6 cortisone injection stating that she knows that they are bad.  She notes that in [place] she had benefit from previous musculoskeletal symptoms with B12 and diclofenac injections.   Pertinent review of systems: No fevers or chills  Relevant historical information: History of carpal tunnel syndrome in the past.   Exam:  BP 128/86   Pulse 81   Ht 5\' 2"  (1.575 m)   Wt 134 lb 9.6 oz (61.1 kg)   SpO2 97%   BMI 24.62 kg/m  General: Well Developed, well nourished, and in no acute distress.   MSK: Right wrist: Swollen erythematous radial wrist and radial styloid.  Tender palpation at radial styloid. Decreased wrist motion.  Significant pain with ulnar and radial deviation.  Positive Finkelstein's test.     Assessment and Plan: 51 y.o. female with right radial wrist pain due to de Quervain's tenosynovitis.  At this point she is significantly failing conservative management.  Typical next step would be steroid injection.  She is very reluctant to consider this as an option.   Instead we will do hand therapy including iontophoresis which could help.  Additionally she would like to have an intermuscular ketorolac injection which could help at least right now and oral diclofenac.  Recheck in 6 weeks.  If not  better I think at that point either she is going to need to have surgery or steroid injection.  If you are going to do a steroid injection in clinic I think she is going to need some sort of anxiolytic prior to the injections such as lorazepam.  Visit conducted using a Spanish interpreter   PDMP not reviewed this encounter. Orders Placed This Encounter  Procedures   Ambulatory referral to Physical Therapy    Referral Priority:   Routine    Referral Type:   Physical Medicine    Referral Reason:   Specialty Services Required    Requested Specialty:   Physical Therapy    Number of Visits Requested:   1   Meds ordered this encounter  Medications   diclofenac (VOLTAREN) 75 MG EC tablet    Sig: Take 1 tablet (75 mg total) by mouth 2 (two) times daily as needed.    Dispense:  60 tablet    Refill:  1   ketorolac (TORADOL) injection 60 mg     Discussed warning signs or symptoms. Please see discharge instructions. Patient expresses understanding.   The above documentation has been reviewed and is accurate and complete 44, M.D.  Total encounter time 30 minutes including face-to-face time with the patient and, reviewing past medical record, and charting on the date of service.

## 2022-02-04 NOTE — Patient Instructions (Addendum)
Thank you for coming in today.   Son [phone]  Physical Therapy [facility] [address], [place] [address] Phone: [phone]  Please use Voltaren gel (Generic Diclofenac Gel) up to 4x daily for pain as needed.  This is available over-the-counter as both the name brand Voltaren gel and the generic diclofenac gel.   Regresse Con 6 semanas. (6 week)

## 2022-02-14 DIAGNOSIS — M79644 Pain in right finger(s): Secondary | ICD-10-CM | POA: Diagnosis not present

## 2022-02-14 DIAGNOSIS — M25641 Stiffness of right hand, not elsewhere classified: Secondary | ICD-10-CM | POA: Diagnosis not present

## 2022-02-14 DIAGNOSIS — M79641 Pain in right hand: Secondary | ICD-10-CM | POA: Diagnosis not present

## 2022-02-14 DIAGNOSIS — M2681 Anterior soft tissue impingement: Secondary | ICD-10-CM | POA: Diagnosis not present

## 2022-02-23 DIAGNOSIS — M2681 Anterior soft tissue impingement: Secondary | ICD-10-CM | POA: Diagnosis not present

## 2022-02-23 DIAGNOSIS — M79644 Pain in right finger(s): Secondary | ICD-10-CM | POA: Diagnosis not present

## 2022-02-23 DIAGNOSIS — M79641 Pain in right hand: Secondary | ICD-10-CM | POA: Diagnosis not present

## 2022-02-23 DIAGNOSIS — M25641 Stiffness of right hand, not elsewhere classified: Secondary | ICD-10-CM | POA: Diagnosis not present

## 2022-03-07 DIAGNOSIS — M25641 Stiffness of right hand, not elsewhere classified: Secondary | ICD-10-CM | POA: Diagnosis not present

## 2022-03-07 DIAGNOSIS — M79644 Pain in right finger(s): Secondary | ICD-10-CM | POA: Diagnosis not present

## 2022-03-07 DIAGNOSIS — M79641 Pain in right hand: Secondary | ICD-10-CM | POA: Diagnosis not present

## 2022-03-07 DIAGNOSIS — M2681 Anterior soft tissue impingement: Secondary | ICD-10-CM | POA: Diagnosis not present

## 2022-03-09 DIAGNOSIS — M79641 Pain in right hand: Secondary | ICD-10-CM | POA: Diagnosis not present

## 2022-03-09 DIAGNOSIS — M79644 Pain in right finger(s): Secondary | ICD-10-CM | POA: Diagnosis not present

## 2022-03-09 DIAGNOSIS — M2681 Anterior soft tissue impingement: Secondary | ICD-10-CM | POA: Diagnosis not present

## 2022-03-09 DIAGNOSIS — M25641 Stiffness of right hand, not elsewhere classified: Secondary | ICD-10-CM | POA: Diagnosis not present

## 2022-03-21 ENCOUNTER — Encounter: Payer: Self-pay | Admitting: Emergency Medicine

## 2022-04-15 ENCOUNTER — Ambulatory Visit: Payer: 59 | Admitting: Family Medicine

## 2022-04-15 NOTE — Progress Notes (Deleted)
   I, Peterson Lombard, LAT, ATC acting as a scribe for Toni Leader, MD.  Toni Hall is a 51 y.o. female who presents to [place] at [facility] today for f/u R wrist pain thought to be due to DeQuervain's tenosynovitis. Pt was last seen by Dr. Georgina Snell on 02/04/22 and was referred to hand therapy including iontophoresis, she was given an IM ketorolac injection, and prescribed oral diclofenac. Today, pt reports   Pertinent review of systems: ***  Relevant historical information: ***   Exam:  There were no vitals taken for this visit. General: Well Developed, well nourished, and in no acute distress.   MSK: ***    Lab and Radiology Results No results found for this or any previous visit (from the past 72 hour(s)). No results found.     Assessment and Plan: 51 y.o. female with ***   PDMP not reviewed this encounter. No orders of the defined types were placed in this encounter.  No orders of the defined types were placed in this encounter.    Discussed warning signs or symptoms. Please see discharge instructions. Patient expresses understanding.   ***

## 2022-05-05 ENCOUNTER — Other Ambulatory Visit: Payer: Self-pay | Admitting: Family Medicine

## 2022-05-10 MED ORDER — DICLOFENAC SODIUM 75 MG PO TBEC: 75.0000 mg | DELAYED_RELEASE_TABLET | Freq: Two times a day (BID) | ORAL | 1 refills | Status: AC | PRN

## 2022-07-05 ENCOUNTER — Ambulatory Visit: Payer: 59 | Admitting: Emergency Medicine

## 2022-09-09 ENCOUNTER — Ambulatory Visit (HOSPITAL_BASED_OUTPATIENT_CLINIC_OR_DEPARTMENT_OTHER): Payer: 59 | Admitting: Family Medicine

## 2023-05-26 DIAGNOSIS — Z01419 Encounter for gynecological examination (general) (routine) without abnormal findings: Secondary | ICD-10-CM | POA: Insufficient documentation

## 2023-05-26 DIAGNOSIS — G8929 Other chronic pain: Secondary | ICD-10-CM | POA: Insufficient documentation

## 2023-08-01 ENCOUNTER — Ambulatory Visit: Payer: 59 | Admitting: Emergency Medicine

## 2023-08-10 ENCOUNTER — Ambulatory Visit: Payer: 59 | Admitting: Emergency Medicine

## 2023-08-14 ENCOUNTER — Ambulatory Visit: Payer: No Typology Code available for payment source | Admitting: Emergency Medicine

## 2023-08-14 ENCOUNTER — Encounter: Payer: Self-pay | Admitting: Emergency Medicine

## 2023-08-14 VITALS — BP 120/74 | HR 67 | Temp 98.3°F | Ht 62.0 in | Wt 125.0 lb

## 2023-08-14 DIAGNOSIS — G8929 Other chronic pain: Secondary | ICD-10-CM | POA: Insufficient documentation

## 2023-08-14 DIAGNOSIS — Z86718 Personal history of other venous thrombosis and embolism: Secondary | ICD-10-CM | POA: Insufficient documentation

## 2023-08-14 DIAGNOSIS — D219 Benign neoplasm of connective and other soft tissue, unspecified: Secondary | ICD-10-CM | POA: Insufficient documentation

## 2023-08-14 DIAGNOSIS — R102 Pelvic and perineal pain: Secondary | ICD-10-CM

## 2023-08-14 DIAGNOSIS — I10 Essential (primary) hypertension: Secondary | ICD-10-CM

## 2023-08-14 HISTORY — DX: Essential (primary) hypertension: I10

## 2023-08-14 LAB — COMPREHENSIVE METABOLIC PANEL
ALT: 13 U/L (ref 0–35)
AST: 14 U/L (ref 0–37)
Albumin: 4.4 g/dL (ref 3.5–5.2)
Alkaline Phosphatase: 80 U/L (ref 39–117)
BUN: 11 mg/dL (ref 6–23)
CO2: 27 meq/L (ref 19–32)
Calcium: 9.2 mg/dL (ref 8.4–10.5)
Chloride: 99 meq/L (ref 96–112)
Creatinine, Ser: 0.68 mg/dL (ref 0.40–1.20)
GFR: 99.1 mL/min (ref >60.00)
Glucose, Bld: 85 mg/dL (ref 70–99)
Potassium: 3.7 meq/L (ref 3.5–5.1)
Sodium: 135 meq/L (ref 135–145)
Total Bilirubin: 0.5 mg/dL (ref 0.2–1.2)
Total Protein: 7.6 g/dL (ref 6.0–8.3)

## 2023-08-14 LAB — CBC WITH DIFFERENTIAL/PLATELET
Basophils Absolute: 0 10*3/uL (ref 0.0–0.1)
Basophils Relative: 0.4 % (ref 0.0–3.0)
Eosinophils Absolute: 0.1 10*3/uL (ref 0.0–0.7)
Eosinophils Relative: 0.8 % (ref 0.0–5.0)
HCT: 41.5 % (ref 36.0–46.0)
Hemoglobin: 13.6 g/dL (ref 12.0–15.0)
Lymphocytes Relative: 34.7 % (ref 12.0–46.0)
Lymphs Abs: 2.5 10*3/uL (ref 0.7–4.0)
MCHC: 32.7 g/dL (ref 30.0–36.0)
MCV: 80.9 fL (ref 78.0–100.0)
Monocytes Absolute: 0.5 10*3/uL (ref 0.1–1.0)
Monocytes Relative: 6.3 % (ref 3.0–12.0)
Neutro Abs: 4.2 10*3/uL (ref 1.4–7.7)
Neutrophils Relative %: 57.8 % (ref 43.0–77.0)
Platelets: 281 10*3/uL (ref 150.0–400.0)
RBC: 5.13 Mil/uL — ABNORMAL HIGH (ref 3.87–5.11)
RDW: 14.8 % (ref 11.5–15.5)
WBC: 7.3 10*3/uL (ref 4.0–10.5)

## 2023-08-14 LAB — URINALYSIS
Bilirubin Urine: NEGATIVE
Hgb urine dipstick: NEGATIVE
Ketones, ur: NEGATIVE
Leukocytes,Ua: NEGATIVE
Nitrite: NEGATIVE
Specific Gravity, Urine: <=1.005 — AB (ref 1.000–1.030)
Total Protein, Urine: NEGATIVE
Urine Glucose: NEGATIVE
Urobilinogen, UA: 0.2 (ref 0.0–1.0)
pH: 7 (ref 5.0–8.0)

## 2023-08-14 LAB — LIPID PANEL
Cholesterol: 227 mg/dL — ABNORMAL HIGH (ref 0–200)
HDL: 76.6 mg/dL (ref >39.00)
LDL Cholesterol: 134 mg/dL — ABNORMAL HIGH (ref 0–99)
NonHDL: 150.59
Total CHOL/HDL Ratio: 3
Triglycerides: 83 mg/dL (ref 0.0–149.0)
VLDL: 16.6 mg/dL (ref 0.0–40.0)

## 2023-08-14 LAB — HEMOGLOBIN A1C: Hgb A1c MFr Bld: 5.9 % (ref 4.6–6.5)

## 2023-08-14 MED ORDER — ELIQUIS 5 MG PO TABS: 5.0000 mg | ORAL_TABLET | Freq: Two times a day (BID) | ORAL | 0 refills | Status: DC

## 2023-08-14 NOTE — Patient Instructions (Signed)
Trombosis venosa profunda Deep Vein Thrombosis  La trombosis venosa profunda (TVP) es una afeccin en la que se forma un cogulo de sangre en una vena del sistema venoso profundo. Esto puede ocurrir [employer] parte inferior de la pierna, el muslo, la pelvis, el brazo o el cuello. Un cogulo es sangre que se ha espesado y convertido en gel o se ha solidificado. Esta afeccin es grave y puede poner en peligro la vida si el cogulo llega a las arterias de los pulmones y causa una obstruccin (embolia pulmonar). La TVP tambin puede daar las venas de la pierna, y eso puede causar una enfermedad venosa a largo plazo, dolor en la pierna, hinchazn, cambio de color y lceras o llagas (sndrome postrombtico). Cules son las causas? Esta afeccin puede ser causada por lo siguiente: Neomia Dear ralentizacin de la circulacin sangunea. Dao en una vena. Una afeccin que hace que la sangre se coagule ms fcilmente, como ciertos trastornos hemorrgicos. Qu incrementa el riesgo? Los siguientes factores pueden hacer que sea ms propenso a Aeronautical engineer afeccin: Obesidad. Tener edad avanzada, en especial ms de 60 aos. Ser inactivo o no moverse (estilo de vida sedentario). Estas medidas pueden incluir: Permanecer sentado o recostado durante ms de 4 a 6 horas por otro motivo que no sea dormir por la noche. Estar en el hospital o someterse a una ciruga mayor o prolongada. Tener lesiones recientes en los [employer] reducen el movimiento, como roturas (fracturas), especialmente en las extremidades inferiores. Haberse sometido a una ciruga ortopdica reciente en las extremidades inferiores. Kennon Holter, dar a luz o haber dado a Careers adviser. Tomar medicamentos que contienen estrgenos, como las pldoras anticonceptivas o la terapia de reemplazo hormonal. Consumir productos que contengan nicotina o tabaco, especialmente si [place] un mtodo anticonceptivo hormonal. Tener antecedentes de una enfermedad de  los vasos sanguneos (enfermedad vascular perifrica) o enfermedad cardaca congestiva. Tener antecedentes de cncer, especialmente si recibi tratamiento con quimioterapia. Cules son los signos o sntomas? Los sntomas de esta afeccin incluyen: Hinchazn, Engineer, mining, presin o sensibilidad en un brazo o una pierna. Un brazo o una pierna se calienta, enrojece o cambia de color. Una pierna se torna muy plida o azulada. Puede tener una TVP grande. Esto es poco frecuente. Si el cogulo est [employer] pierna, puede notar que los sntomas empeoran al pararse o Advertising account planner. En algunos casos, no hay sntomas. Cmo se diagnostica? Esta afeccin se diagnostica mediante: Los antecedentes mdicos y un examen fsico. Pruebas, como, por ejemplo: Anlisis de sangre para verificar qu tan bien coagula su sangre. Una ecografa Doppler. Esta es la mejor forma de detectar una TVP. Venograma por tomografa computarizada (TC). Se inyecta un tinte de contraste en una vena y se toman radiografas para verificar si hay cogulos. Esto es til para las venas del pecho o de la pelvis. Cmo se trata? El tratamiento de esta afeccin depende de lo siguiente: La causa de su TVP. El tamao y la ubicacin de la TVP, o tener ms de una TVP. Su riesgo de tener una hemorragia y tener ms cogulos. Otras enfermedades que [employer]. El tratamiento puede incluir: Tomar un medicamento que diluye la sangre (anticoagulante) para evitar que se formen ms cogulos o que los cogulos actuales aumenten de Recruitment consultant. Usar medias de compresin. Aplicarse inyecciones de medicamentos para romper el cogulo (tromblisis dirigida por catter). Procedimientos quirrgicos cuando la TVP es grave o difcil de tratar. Estos se pueden realizar para: Aislar y Administrator, arts. Colocar un filtro en  la vena cava inferior (VCI). Este filtro se coloca en una vena grande que se llama vena cava inferior para capturar los cogulos de sangre antes de  que lleguen a los pulmones. Puede recibir tratamientos mdicos durante 6 meses o ms tiempo. Siga estas instrucciones en su casa: Si est tomando anticoagulantes, tenga en cuenta lo siguiente: Hable con el mdico antes de tomar cualquier medicamento que contenga aspirina o antiinflamatorios no esteroideos (AINE), como el ibuprofeno. Estos medicamentos aumentan el riesgo de tener una hemorragia peligrosa. Tome los [employer] Systems se lo indicaron, [employer] a la misma hora. No se saltee una dosis. No tome ms de la dosis recetada. Esto es importante. Pregntele a su mdico sobre los alimentos y medicamentos que pueden cambiar la forma en que funciona el anticoagulante o Product/process development scientist con [place]. Evite estos alimentos y medicamentos si se lo indican. Evite todo lo que pueda causar sangrados o moretones. Puede sangrar con ms facilidad mientras toma anticoagulantes. Tenga sumo cuidado al usar cuchillos, tijeras u otros objetos filosos. Afitese con Montez Hageman elctrica en lugar de hacerlo con una hoja de afeitar. Evite las actividades que podran causarle lesiones o moretones y siga las instrucciones para evitar las cadas. Informe al mdico si ha tenido sangrado interno, lceras sangrantes o enfermedades neurolgicas, como accidentes cerebrovasculares o aneurismas cerebrales. Use un brazalete de alerta mdica o lleve una tarjeta con una lista de los medicamentos que toma. Instrucciones generales Use los medicamentos de venta libre y los recetados solamente como se lo haya indicado el mdico. Retome sus actividades normales segn lo indicado por el mdico. Pregntele al mdico qu actividades son seguras para usted. Si se lo recomienda el mdico, use medias de compresin segn sus indicaciones. Estas medias ayudan a Transport planner formacin de cogulos de [employer] y a reducir la hinchazn de las piernas. Nunca use medias de compresin para dormir por la noche. Concurra a todas las visitas de  seguimiento. Esto es importante. Dnde buscar ms informacin American Heart Association (Asociacin Estadounidense del Corazn): www.heart.org Centers for Disease Control and Prevention (Centros para Air traffic controller y Psychiatrist de Event organiser): FootballExhibition.com.br National Heart, Lung, and Blood Institute (Instituto Nacional del [place], los Pulmones y la [place]): [email or web address] Comunquese con un mdico si: Se saltea una dosis del anticoagulante. Tiene moretones inusuales u otros cambios de color. Tiene dolor, hinchazn o enrojecimiento nuevos en un brazo o una pierna o se produce un empeoramiento de alguno de estos sntomas. Tiene entumecimiento u hormigueo que [employer] Corporation en un brazo o una pierna. Tiene un cambio significativo de color (palidez o color azulado) en la extremidad que tiene la TVP. Solicite ayuda de inmediato si: Tiene signos o sntomas de que un cogulo de sangre se ha movido a los pulmones. Pueden incluir: Falta de aire. Dolor de pecho. Latidos cardacos acelerados o irregulares (palpitaciones). Sensacin de desvanecimiento, mareos o [employer]. Tos con sangre. Tiene signos o sntomas de que la sangre est demasiado anticoagulada. Pueden incluir: [employer], las heces o la [place]. Un corte que no deja de sangrar. Perodo menstrual ms abundante que lo normal. Dolor de cabeza intenso o confusin. Estos sntomas pueden Customer service manager. Solicite ayuda de inmediato. Llame al 911. No espere a ver si los sntomas desaparecen. No conduzca por sus propios medios [employer] hospital. Resumen La trombosis venosa profunda (TVP) sucede cuando se forma un cogulo de sangre en una vena profunda. Esto puede ocurrir [employer] parte inferior de la pierna, el muslo, la pelvis, el  brazo o el cuello. Los sntomas afectan el brazo o la pierna, y pueden incluir hinchazn, Engineer, mining, sensibilidad, Airline pilot, enrojecimiento o cambio de color. Esta afeccin se puede tratar con medicamentos. En casos  graves, se puede Education officer, environmental un procedimiento o una ciruga para extirpar o [employer]. Si est recibiendo anticoagulantes, tmelos exactamente como se lo hayan indicado. No se saltee una dosis. No tome ms de lo recetado. Obtenga ayuda de inmediato si tiene dolor de cabeza intenso, le falta el aire, siente dolor en el pecho, tiene latidos cardacos acelerados o [place], o ve sangre en el vmito, la orina o las heces. Esta informacin no tiene Theme park manager el consejo del mdico. Asegrese de hacerle al mdico cualquier pregunta que tenga. Document Revised: 02/26/2021 Document Reviewed: 02/26/2021 Elsevier Patient Education  2024 [employer].

## 2023-08-14 NOTE — Assessment & Plan Note (Addendum)
Causing abnormal vaginal bleeding and chronic pain Needs follow-up with gynecologist Active and affecting quality of life Pain management discussed

## 2023-08-14 NOTE — Assessment & Plan Note (Signed)
Diagnosed last November Provoked by estrogen therapy Presently on Eliquis 5 mg twice a day Recommend follow-up with vascular surgeon Referral placed today

## 2023-08-14 NOTE — Assessment & Plan Note (Signed)
Recently diagnosed Continue lisinopril 10 mg daily

## 2023-08-14 NOTE — Assessment & Plan Note (Addendum)
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  Advised against NSAIDs Recommend Tylenol for pain as needed

## 2023-08-14 NOTE — Progress Notes (Signed)
Toni Hall 53 y.o.   Chief Complaint  Patient presents with   Medical Management of Chronic Issues    Referral to Ob-gyn and several concerns     HISTORY OF PRESENT ILLNESS: This is a 53 y.o. female here for follow-up of recently acquired medical problems Last and only office visit with me on 12/28/2021.  Was healthy until she was started on estrogen supplementation and subsequently developed DVT to right lower leg Was started on Eliquis which is still taking.  Needs follow-up Also developed vaginal bleeding shortly after having sexual intercourse last year.  Found to have large fibroma and recommended to have surgery. Had a change of insurance but can still see her gynecologist.  May need referral. No other complaints or medical concerns today   HPI   Prior to Admission medications   Medication Sig Start Date End Date Taking? Authorizing Provider  busPIRone (BUSPAR) 15 MG tablet Take by mouth. 07/29/23  Yes [provider]  diclofenac (VOLTAREN) 75 MG EC tablet Take 1 tablet (75 mg total) by mouth 3 times/day as needed-between meals & bedtime for moderate pain. 05/10/22  Yes Rodolph Bong, MD  dicyclomine (BENTYL) 10 MG capsule Take 10 mg by mouth. 06/30/23  Yes [provider]  ELIQUIS 5 MG TABS tablet Take 5 mg by mouth 2 (two) times daily.   Yes [provider]  lisinopril (ZESTRIL) 10 MG tablet Take 10 mg by mouth.   Yes [provider]  Multiple Vitamin (MULTIVITAMIN PO) Take by mouth. Patient not taking: Reported on 08/14/2023    [provider]  tiZANidine (ZANAFLEX) 4 MG tablet Take 1 tablet (4 mg total) by mouth every 6 (six) hours as needed for muscle spasms. Patient not taking: Reported on 08/14/2023 01/07/22   Rodolph Bong, MD    No Known Allergies  There are no active problems to display for this patient.   Past Medical History:  Diagnosis Date   Carpal tunnel syndrome     Past Surgical History:   Procedure Laterality Date   ACHILLES TENDON REPAIR     CARPAL TUNNEL RELEASE Bilateral    CARPAL TUNNEL RELEASE Left 04/22/2021   Procedure: LEFT CARPAL TUNNEL RELEASE;  Surgeon: Cammy Copa, MD;  Location: [facility];  Service: Orthopedics;  Laterality: Left;   EYE SURGERY Bilateral    FEMUR SURGERY Left    HERNIA REPAIR      Social History   Socioeconomic History   Marital status: Single    Spouse name: Not on file   Number of children: Not on file   Years of education: Not on file   Highest education level: Not on file  Occupational History   Not on file  Tobacco Use   Smoking status: Never   Smokeless tobacco: Never  Substance and Sexual Activity   Alcohol use: No   Drug use: No   Sexual activity: Not on file  Other Topics Concern   Not on file  Social History Narrative   Not on file   Social Drivers of Health   Financial Resource Strain: Low Risk  (05/25/2023)   Received from [facility]   Overall Financial Resource Strain (CARDIA)    Difficulty of Paying Living Expenses: Not hard at all  Food Insecurity: No Food Insecurity (05/25/2023)   [facility] from [facility]   Hunger Vital Sign    Worried About Running Out of Food in the Last Year: Never true    Ran  Out of Food in the Last Year: Never true  Transportation Needs: No Transportation Needs (05/25/2023)   [facility] - Transportation    Lack of Transportation (Medical): No    Lack of Transportation (Non-Medical): No  Physical Activity: Not on file  Stress: Not on file  Social Connections: Unknown (11/22/2021)   Received from [facility]   Social Network    Social Network: Not on file  Intimate Partner Violence: Not At Risk (07/16/2023)   Received from [facility]   HITS    Over the last 12 months how often did your partner physically hurt you?: Never    Over the last 12 months how often did your partner insult you or talk down to you?: Never     Over the last 12 months how often did your partner threaten you with physical harm?: Never    Over the last 12 months how often did your partner scream or curse at you?: Never    History reviewed. No pertinent family history.   Review of Systems  Constitutional: Negative.  Negative for chills and fever.  HENT: Negative.  Negative for congestion and sore throat.   Respiratory: Negative.  Negative for cough and shortness of breath.   Cardiovascular: Negative.  Negative for chest pain and palpitations.  Gastrointestinal:  Negative for abdominal pain, diarrhea, nausea and vomiting.  Genitourinary: Negative.  Negative for dysuria and hematuria.  Skin: Negative.  Negative for rash.  Neurological: Negative.  Negative for dizziness and headaches.  All other systems reviewed and are negative.   Vitals:   08/14/23 1447  BP: 120/74  Pulse: 67  Temp: 98.3 F (36.8 C)  SpO2: 99%    Physical Exam Vitals reviewed.  Constitutional:      Appearance: Normal appearance.  HENT:     Head: Normocephalic.  Eyes:     Extraocular Movements: Extraocular movements intact.     Pupils: Pupils are equal, round, and reactive to light.  Cardiovascular:     Rate and Rhythm: Normal rate and regular rhythm.     Pulses: Normal pulses.     Heart sounds: Normal heart sounds.  Pulmonary:     Effort: Pulmonary effort is normal.     Breath sounds: Normal breath sounds.  Abdominal:     Palpations: Abdomen is soft.     Tenderness: There is no abdominal tenderness.  Musculoskeletal:     Cervical back: No tenderness.  Lymphadenopathy:     Cervical: No cervical adenopathy.  Skin:    General: Skin is warm and dry.     Capillary Refill: Capillary refill takes less than 2 seconds.  Neurological:     General: No focal deficit present.     Mental Status: She is alert and oriented to person, place, and time.  Psychiatric:        Mood and Affect: Mood normal.        Behavior: Behavior normal.       ASSESSMENT & PLAN: A total of 42 minutes was spent with the patient and counseling/coordination of care regarding preparing for this visit, review of most recent office visit notes, review of multiple chronic medical conditions and their management, review of all medications, review of most recent bloodwork results, review of health maintenance items, education on nutrition, prognosis, documentation, and need for follow up.   Problem List Items Addressed This Visit       Cardiovascular and Mediastinum   Essential hypertension   Recently  diagnosed Continue lisinopril 10 mg daily      Relevant Medications   lisinopril (ZESTRIL) 10 MG tablet   ELIQUIS 5 MG TABS tablet     Other   Chronic pelvic pain in female   Already evaluated for this problem by gynecologist Wants to follow-up with same gynecologist Found to have large fibroma and surgery was recommended Pain management discussed.  Advised against NSAIDs Recommend Tylenol for pain as needed      Relevant Orders   Urinalysis   CBC with Differential/Platelet   Comprehensive metabolic panel   Hemoglobin A1c   Lipid panel   Leiomyofibroma - Primary   Causing abnormal vaginal bleeding and chronic pain Needs follow-up with gynecologist Active and affecting quality of life Pain management discussed      Relevant Orders   Urinalysis   CBC with Differential/Platelet   Comprehensive metabolic panel   Hemoglobin A1c   Lipid panel   History of DVT (deep vein thrombosis)   Diagnosed last November Provoked by estrogen therapy Presently on Eliquis 5 mg twice a day Recommend follow-up with vascular surgeon Referral placed today      Relevant Medications   ELIQUIS 5 MG TABS tablet   Other Relevant Orders   Ambulatory referral to Vascular Surgery   Urinalysis   CBC with Differential/Platelet   Comprehensive metabolic panel   Hemoglobin A1c   Lipid panel   Patient Instructions  Trombosis venosa profunda Deep  Vein Thrombosis  La trombosis venosa profunda (TVP) es una afeccin en la que se forma un cogulo de sangre en una vena del sistema venoso profundo. Esto puede ocurrir [employer] parte inferior de la pierna, el muslo, la pelvis, el brazo o el cuello. Un cogulo es sangre que se ha espesado y convertido en gel o se ha solidificado. Esta afeccin es grave y puede poner en peligro la vida si el cogulo llega a las arterias de los pulmones y causa una obstruccin (embolia pulmonar). La TVP tambin puede daar las venas de la pierna, y eso puede causar una enfermedad venosa a largo plazo, dolor en la pierna, hinchazn, cambio de color y lceras o llagas (sndrome postrombtico). Cules son las causas? Esta afeccin puede ser causada por lo siguiente: Neomia Dear ralentizacin de la circulacin sangunea. Dao en una vena. Una afeccin que hace que la sangre se coagule ms fcilmente, como ciertos trastornos hemorrgicos. Qu incrementa el riesgo? Los siguientes factores pueden hacer que sea ms propenso a Aeronautical engineer afeccin: Obesidad. Tener edad avanzada, en especial ms de 60 aos. Ser inactivo o no moverse (estilo de vida sedentario). Estas medidas pueden incluir: Permanecer sentado o recostado durante ms de 4 a 6 horas por otro motivo que no sea dormir por la noche. Estar en el hospital o someterse a una ciruga mayor o prolongada. Tener lesiones recientes en los [employer] reducen el movimiento, como roturas (fracturas), especialmente en las extremidades inferiores. Haberse sometido a una ciruga ortopdica reciente en las extremidades inferiores. Kennon Holter, dar a luz o haber dado a Careers adviser. Tomar medicamentos que contienen estrgenos, como las pldoras anticonceptivas o la terapia de reemplazo hormonal. Consumir productos que contengan nicotina o tabaco, especialmente si [place] un mtodo anticonceptivo hormonal. Tener antecedentes de una enfermedad de los vasos sanguneos (enfermedad  vascular perifrica) o enfermedad cardaca congestiva. Tener antecedentes de cncer, especialmente si recibi tratamiento con quimioterapia. Cules son los signos o sntomas? Los sntomas de esta afeccin incluyen: Hinchazn, Engineer, mining, presin o sensibilidad en un brazo o Neomia Dear  pierna. Un brazo o una pierna se calienta, enrojece o cambia de color. Una pierna se torna muy plida o azulada. Puede tener una TVP grande. Esto es poco frecuente. Si el cogulo est [employer] pierna, puede notar que los sntomas empeoran al pararse o Advertising account planner. En algunos casos, no hay sntomas. Cmo se diagnostica? Esta afeccin se diagnostica mediante: Los antecedentes mdicos y un examen fsico. Pruebas, como, por ejemplo: Anlisis de sangre para verificar qu tan bien coagula su sangre. Una ecografa Doppler. Esta es la mejor forma de detectar una TVP. Venograma por tomografa computarizada (TC). Se inyecta un tinte de contraste en una vena y se toman radiografas para verificar si hay cogulos. Esto es til para las venas del pecho o de la pelvis. Cmo se trata? El tratamiento de esta afeccin depende de lo siguiente: La causa de su TVP. El tamao y la ubicacin de la TVP, o tener ms de una TVP. Su riesgo de tener una hemorragia y tener ms cogulos. Otras enfermedades que [employer]. El tratamiento puede incluir: Tomar un medicamento que diluye la sangre (anticoagulante) para evitar que se formen ms cogulos o que los cogulos actuales aumenten de Recruitment consultant. Usar medias de compresin. Aplicarse inyecciones de medicamentos para romper el cogulo (tromblisis dirigida por catter). Procedimientos quirrgicos cuando la TVP es grave o difcil de tratar. Estos se pueden realizar para: Aislar y Administrator, arts. Colocar un filtro en la vena cava inferior (VCI). Este filtro se coloca en una vena grande que se llama vena cava inferior para capturar los cogulos de sangre antes de que lleguen a los  pulmones. Puede recibir tratamientos mdicos durante 6 meses o ms tiempo. Siga estas instrucciones en su casa: Si est tomando anticoagulantes, tenga en cuenta lo siguiente: Hable con el mdico antes de tomar cualquier medicamento que contenga aspirina o antiinflamatorios no esteroideos (AINE), como el ibuprofeno. Estos medicamentos aumentan el riesgo de tener una hemorragia peligrosa. Tome los [employer]vision Systems se lo indicaron, [employer]Max a la misma hora. No se saltee una dosis. No tome ms de la dosis recetada. Esto es importante. Pregntele a su mdico sobre los alimentos y medicamentos que pueden cambiar la forma en que funciona el anticoagulante o Product/process development scientist con [employer]. Evite estos alimentos y medicamentos si se lo indican. Evite todo lo que pueda causar sangrados o moretones. Puede sangrar con ms facilidad mientras toma anticoagulantes. Tenga sumo cuidado al usar cuchillos, tijeras u otros objetos filosos. Afitese con Montez Hageman elctrica en lugar de hacerlo con una hoja de afeitar. Evite las actividades que podran causarle lesiones o moretones y siga las instrucciones para evitar las cadas. Informe al mdico si ha tenido sangrado interno, lceras sangrantes o enfermedades neurolgicas, como accidentes cerebrovasculares o aneurismas cerebrales. Use un brazalete de alerta mdica o lleve una tarjeta con una lista de los medicamentos que toma. Instrucciones generales Use los medicamentos de venta libre y los recetados solamente como se lo haya indicado el mdico. Retome sus actividades normales segn lo indicado por el mdico. Pregntele al mdico qu actividades son seguras para usted. Si se lo recomienda el mdico, use medias de compresin segn sus indicaciones. Estas medias ayudan a Transport planner formacin de cogulos de [place] y a reducir la hinchazn de las piernas. Nunca use medias de compresin para dormir por la noche. Concurra a todas las visitas de seguimiento. Esto  es importante. Dnde buscar ms informacin American Heart Association (Asociacin Estadounidense del [place]): www.heart.org Centers for Disease Control and Prevention (  Centros para Air traffic controller y Psychiatrist de Event organiser): FootballExhibition.com.br National Heart, Lung, and Blood Institute (Instituto Nacional del [place], los Pulmones y la [place]): PopSteam.is Comunquese con un mdico si: Se saltea una dosis del anticoagulante. Tiene moretones inusuales u otros cambios de color. Tiene dolor, hinchazn o enrojecimiento nuevos en un brazo o una pierna o se produce un empeoramiento de alguno de estos sntomas. Tiene entumecimiento u hormigueo que Cendant Corporation en un brazo o una pierna. Tiene un cambio significativo de color (palidez o color azulado) en la extremidad que tiene la TVP. Solicite ayuda de inmediato si: Tiene signos o sntomas de que un cogulo de sangre se ha movido a los pulmones. Pueden incluir: Falta de aire. Dolor de pecho. Latidos cardacos acelerados o irregulares (palpitaciones). Sensacin de desvanecimiento, mareos o [employer]. Tos con sangre. Tiene signos o sntomas de que la sangre est demasiado anticoagulada. Pueden incluir: [employer], las heces o la [place]. Un corte que no deja de sangrar. Perodo menstrual ms abundante que lo normal. Dolor de cabeza intenso o confusin. Estos sntomas pueden Customer service manager. Solicite ayuda de inmediato. Llame al 911. No espere a ver si los sntomas desaparecen. No conduzca por sus propios medios [employer] hospital. Resumen La trombosis venosa profunda (TVP) sucede cuando se forma un cogulo de sangre en una vena profunda. Esto puede ocurrir [employer] parte inferior de la pierna, el muslo, la pelvis, el brazo o el cuello. Los sntomas afectan el brazo o la pierna, y pueden incluir hinchazn, Engineer, mining, sensibilidad, Airline pilot, enrojecimiento o cambio de color. Esta afeccin se puede tratar con medicamentos. En casos graves, se puede  Education officer, environmental un procedimiento o una ciruga para extirpar o [employer]. Si est recibiendo anticoagulantes, tmelos exactamente como se lo hayan indicado. No se saltee una dosis. No tome ms de lo recetado. Obtenga ayuda de inmediato si tiene dolor de cabeza intenso, le falta el aire, siente dolor en el pecho, tiene latidos cardacos acelerados o [place], o ve sangre en el vmito, la orina o las heces. Esta informacin no tiene Theme park manager el consejo del mdico. Asegrese de hacerle al mdico cualquier pregunta que tenga. Document Revised: 02/26/2021 Document Reviewed: 02/26/2021 Elsevier Patient Education  2024 Elsevier Inc.    Edwina Barth, MD [facility]

## 2023-08-17 ENCOUNTER — Encounter: Payer: 59 | Admitting: Obstetrics and Gynecology

## 2023-08-25 ENCOUNTER — Other Ambulatory Visit: Payer: Self-pay | Admitting: Emergency Medicine

## 2023-08-25 ENCOUNTER — Encounter: Payer: Self-pay | Admitting: Radiology

## 2023-08-25 DIAGNOSIS — Z86718 Personal history of other venous thrombosis and embolism: Secondary | ICD-10-CM

## 2023-09-04 ENCOUNTER — Telehealth: Payer: Self-pay | Admitting: Emergency Medicine

## 2023-09-04 NOTE — Telephone Encounter (Signed)
 Copied from CRM [id number]. Topic: Referral - Request for Referral >> Sep 01, 2023  4:51 PM Eunice Blase wrote: Did the patient discuss referral with their provider in the last year? Yes (If No - schedule appointment) (If Yes - send message)  Appointment offered? Yes  Type of order/referral and detailed reason for visit: Pt wants to go at this location. [facility]  Preference of office, provider, location: [phone] [phone] [address] Laurell Josephs. [address] [place], [place] [address]  If referral order, have you been seen by this specialty before? No (If Yes, this issue or another issue? When? Where?  Can we respond through MyChart? No

## 2023-09-11 NOTE — Addendum Note (Signed)
 Addended by: Aundra Millet on: 09/11/2023 04:51 PM   Modules accepted: Orders

## 2023-11-13 ENCOUNTER — Ambulatory Visit: Payer: No Typology Code available for payment source | Admitting: Emergency Medicine

## 2023-11-13 ENCOUNTER — Telehealth: Payer: Self-pay | Admitting: Emergency Medicine

## 2023-11-13 ENCOUNTER — Encounter: Payer: Self-pay | Admitting: Emergency Medicine

## 2023-11-13 VITALS — BP 102/70 | HR 73 | Temp 98.8°F | Ht 62.0 in | Wt 124.0 lb

## 2023-11-13 DIAGNOSIS — I7300 Raynaud's syndrome without gangrene: Secondary | ICD-10-CM | POA: Diagnosis not present

## 2023-11-13 DIAGNOSIS — M79604 Pain in right leg: Secondary | ICD-10-CM | POA: Insufficient documentation

## 2023-11-13 DIAGNOSIS — G4733 Obstructive sleep apnea (adult) (pediatric): Secondary | ICD-10-CM | POA: Insufficient documentation

## 2023-11-13 DIAGNOSIS — M79605 Pain in left leg: Secondary | ICD-10-CM

## 2023-11-13 DIAGNOSIS — I10 Essential (primary) hypertension: Secondary | ICD-10-CM

## 2023-11-13 DIAGNOSIS — Z86718 Personal history of other venous thrombosis and embolism: Secondary | ICD-10-CM | POA: Diagnosis not present

## 2023-11-13 NOTE — Assessment & Plan Note (Signed)
 Picture shown very typical of Raynaud's phenomena Recommend vascular evaluation.  Referral placed today

## 2023-11-13 NOTE — Assessment & Plan Note (Signed)
 BP Readings from Last 3 Encounters:  11/13/23 102/70  08/14/23 120/74  02/04/22 128/86  Well-controlled hypertension Continue lisinopril 10 mg daily

## 2023-11-13 NOTE — Telephone Encounter (Signed)
 Prescription Request  11/13/2023  LOV: 11/13/2023  What is the name of the medication or equipment?  lisinopril (ZESTRIL) 10 MG tablet ELIQUIS  5 MG TABS tablet  Have you contacted your pharmacy to request a refill? No   Which pharmacy would you like this sent to?  [facility] at [address] in [place], [place]    Patient notified that their request is being sent to the clinical staff for review and that they should receive a response within 2 business days.   Please advise at [facility] [phone]

## 2023-11-13 NOTE — Assessment & Plan Note (Signed)
 Presently on CPAP treatment but needs follow-up Referral for sleep apnea clinic placed today

## 2023-11-13 NOTE — Patient Instructions (Signed)
Fenmeno de Raynaud Raynaud's Phenomenon  El fenmeno de Raynaud es una enfermedad que afecta los vasos sanguneos (arterias) que transportan la sangre a los dedos de las manos y de los pies. Las arterias que [address] a las [place], los labios, los pezones o la punta de la nariz tambin pueden verse afectadas. El fenmeno de Raynaud causa el estrechamiento temporal de las arterias (espasmo). Como consecuencia, la irrigacin de sangre a las zonas afectadas disminuye transitoriamente. Esto suele ocurrir en respuesta a las bajas temperaturas o el estrs. Durante una crisis, la piel de las zonas afectadas se pone blanca, luego azul y finalmente roja. Adems, la persona puede sentir hormigueo o adormecimiento en esas zonas. Generalmente, las crisis duran [place], y [employer] la irrigacin de sangre a la zona se Air traffic controller. En la [employer], el fenmeno de Raynaud no causa problemas graves de [place]. Cules son las causas? En muchos casos, se desconoce la causa de esta afeccin. La afeccin puede manifestarse por s sola (fenmeno de Raynaud primario) o puede estar asociada a otras enfermedades u otros factores (fenmeno de Raynaud secundario). Entre las causas posibles pueden incluirse: Enfermedades o afecciones que daan las arterias. Lesiones o acciones repetitivas que [employer] o los pies. La exposicin a ciertas sustancias qumicas. Tomar medicamentos que producen el estrechamiento de las arterias. Otros trastornos mdicos, tales como lupus, esclerodermia, artritis reumatoide, problemas de tiroides, trastornos de [employer], sndrome de [place] o Gaffer. Qu incrementa el riesgo? Los siguientes factores pueden hacer que sea ms propenso a desarrollar esta afeccin: Tener entre 20 y 41 aos. Ser mujer. Tener antecedentes familiares del fenmeno de Raynaud. Vivir en un lugar con bajas temperaturas. Fumar. Cules son los signos o sntomas? Los sntomas de fenmeno de  Raynaud normalmente se manifiestan cuando una persona se expone a bajas temperaturas o sufre estrs emocional. Pueden durar unos minutos o varias horas. Por lo general, esta enfermedad afecta los dedos de las [address], pero tambin puede [employer] dedos de los pies, los pezones, los labios, las orejas o la punta de la nariz. Entre los sntomas, se pueden incluir los siguientes: Cambios en el color de la piel. La piel de las zonas afectadas se tornar plida o blanca. Luego, puede pasar de blanca a [place] y de [place] a roja, a medida que se restablece la irrigacin normal de sangre a la zona. Adormecimiento, hormigueo o dolor en las zonas afectadas. En los casos graves, los sntomas pueden incluir lo siguiente: Probation officer piel. Deterioro y [employer] tejidos (gangrena). Cmo se diagnostica? Esta afeccin se puede diagnosticar en funcin de lo siguiente: Los sntomas y los antecedentes mdicos. Un examen fsico. Durante el examen, pueden indicarle que ponga las manos en agua fra para determinar si hay una reaccin a la baja temperatura. Estudios, como, por ejemplo: Anlisis de sangre para detectar si hay otras enfermedades o afecciones. Un estudio para Nurse, children's de la sangre a travs de las arterias y las venas (ecografa vascular). Un estudio mediante el cual la piel de la base de las uas de las manos se examina con un microscopio (capilaroscopia del lecho ungueal). Cmo se trata? Durante un episodio, puede tomar medidas para ayudar a que los sntomas desaparezcan ms rpido. Las opciones [employer] brazos como la aspas de un molino de viento, calentarse los dedos debajo de agua caliente o poner los dedos en un pliegue clido del cuerpo, como la [place]. El tratamiento a largo plazo para Acupuncturist  afeccin suele implicar hacer cambios en el estilo de vida y tomar medidas para controlar la exposicin a las Barrister's clerk. En los casos ms graves, pueden usarse medicamentos  (antagonistas del calcio) para Dealer sangunea. Siga estas instrucciones en su casa: Evite las bajas temperaturas Para evitar la exposicin al fro, siga estos pasos: Si es posible, qudese adentro cuando haga fro. Cuando salga durante la poca de bajas temperaturas, vstase con varias capas de ropa y use mitones, un gorro, una bufanda y zapatos abrigados. Use mitones o guantes cuando manipule hielo o comida congelada. Use portavasos o portalatas para los vasos o las latas que contengan bebidas fras. Deje correr el agua caliente durante un rato antes de tomar una ducha o un bao. Caliente el automvil antes de [employer]. Estilo de vida Si es posible, evite las situaciones emotivas y Geographical information systems officer. Trate de encontrar herramientas para controlar el estrs, por ejemplo: Actividad fsica. Yoga. Meditacin. Biorretroalimentacin. No consuma ningn producto que contenga nicotina o tabaco. Estos productos incluyen cigarrillos, tabaco para Theatre manager y aparatos de vapeo, como los Administrator, Civil Service. Si necesita ayuda para dejar de consumir estos productos, consulte al [employer]. Evite ser fumador pasivo. Limite el consumo de cafena. En cambio, tome caf, t y gaseosas descafeinadas. Evite el chocolate. No use herramientas ni maquinaria que vibren. Indicaciones generales Protjase las manos y los pies para no sufrir lesiones, cortes o moretones. Evite el uso de anillos o pulseras muy ajustados. Use medias flojas y zapatos cmodos y amplios. Use los medicamentos de venta libre y los recetados solamente como se lo haya indicado el mdico. Dnde buscar apoyo Raynaud's Association (Asociacin de Raynaud): www.raynauds.org Dnde buscar ms informacin [employer] of Arthritis and Musculoskeletal and Skin Diseases (Instituto [employer] de Artritis y [place] Musculoesquelticas y Arboriculturist): www.niams.http://www.myers.net/ Comunquese con un mdico si: Las [employer] a pesar de los cambios en el estilo de vida. Le aparecen llagas en los dedos de las manos o de los pies que no se cicatrizan. Tiene estras en la piel de los dedos de las manos o de los pies. Tiene fiebre. Tiene dolor o hinchazn en las articulaciones. Tiene una erupcin cutnea. Los sntomas aparecen en un solo lado del cuerpo. Solicite ayuda de inmediato si: Los dedos de las manos o de los pies se tornan negros. Tiene dolor intenso en las zonas afectadas. Estos sntomas pueden representar un problema grave que constituye Radio broadcast assistant. No espere a ver si los sntomas desaparecen. Solicite atencin mdica de inmediato. Comunquese con el servicio de emergencias de su localidad (911 en los [place]). No conduzca por sus propios medios [employer]. Resumen El fenmeno de Raynaud es una enfermedad que afecta las arterias que transportan la sangre a los dedos de las manos y de los pies, las [place], los labios, los pezones o la punta de la nariz. En muchos casos, se desconoce la causa de esta afeccin. Los sntomas de esta afeccin incluyen cambios en el color de la piel junto con entumecimiento y hormigueo de la zona afectada. El tratamiento de esta enfermedad incluye cambios en el estilo de vida y reduccin de la exposicin a las bajas temperaturas. Pueden usarse medicamentos para los casos graves de Astronomer. Comunquese con su mdico si la afeccin empeora aunque reciba tratamiento. Esta informacin no tiene Theme park manager el consejo del mdico. Asegrese de hacerle al mdico cualquier pregunta que tenga. Document Revised: 09/14/2020 Document Reviewed: 09/14/2020 Elsevier Patient Education  2024 [employer].

## 2023-11-13 NOTE — Assessment & Plan Note (Signed)
 History of DVT last November Continues on Eliquis  5 mg twice a day Needs follow-up ultrasound of lower extremities

## 2023-11-13 NOTE — Progress Notes (Signed)
 Toni Hall 53 y.o.   Chief Complaint  Patient presents with   Follow-up    3 month f/u. Patient states she thinks she has a clot in her right leg. Patient needing a new referral for vascular surgery states no one told her about the appointment and they charged her for the no show     HISTORY OF PRESENT ILLNESS: This is a 53 y.o. female A1A with history of DVT presently on Eliquis  seen by me 3 months ago.  Here for follow-up Was referred to vascular surgery but was not able to schedule appointment. Also complaining of intermittent episodes of numbness and tingling to fingertips with skin discoloration.  Short-lived episodes and spontaneously resolving.  Unknown trigger.  Pictures shown look like Raynaud's phenomena. Also has history of obstructive sleep apnea.  Requesting referral to sleep clinic.  Well-controlled hypertension well-controlled hypertension  HPI   Prior to Admission medications   Medication Sig Start Date End Date Taking? Authorizing Provider  busPIRone (BUSPAR) 15 MG tablet Take by mouth. 07/29/23  Yes [provider]  lisinopril (ZESTRIL) 10 MG tablet Take 10 mg by mouth.   Yes [provider]  methocarbamol  (ROBAXIN ) 500 MG tablet Take 500 mg by mouth 2 (two) times daily. 09/29/23  Yes [provider]  diclofenac  (VOLTAREN ) 75 MG EC tablet Take 1 tablet (75 mg total) by mouth 3 times/day as needed-between meals & bedtime for moderate pain. Patient not taking: Reported on 11/13/2023 05/10/22   Syliva Even, MD  dicyclomine  (BENTYL ) 10 MG capsule Take 10 mg by mouth. Patient not taking: Reported on 11/13/2023 06/30/23   [provider]  ELIQUIS  5 MG TABS tablet Take 1 tablet (5 mg total) by mouth 2 (two) times daily. 08/14/23 11/12/23  Clinten Howk Jose, MD  Multiple Vitamin (MULTIVITAMIN PO) Take by mouth. Patient not taking: Reported on 12/28/2021    [provider]  tiZANidine  (ZANAFLEX ) 4 MG tablet Take 1 tablet  (4 mg total) by mouth every 6 (six) hours as needed for muscle spasms. 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   History of DVT (deep vein thrombosis) 08/14/2023    Past Medical History:  Diagnosis Date   Carpal tunnel syndrome    Essential hypertension 08/14/2023    Past Surgical History:  Procedure Laterality Date   ACHILLES TENDON REPAIR     CARPAL TUNNEL RELEASE Bilateral    CARPAL TUNNEL RELEASE Left 04/22/2021   Procedure: LEFT CARPAL TUNNEL RELEASE;  Surgeon: Jasmine Mesi, MD;  Location: [facility];  Service: Orthopedics;  Laterality: Left;   EYE SURGERY Bilateral    FEMUR SURGERY Left    HERNIA REPAIR      Social History   Socioeconomic History   Marital status: Single    Spouse name: Not on file   Number of children: Not on file   Years of education: Not on file   Highest education level: Not on file  Occupational History   Not on file  Tobacco Use   Smoking status: Never   Smokeless tobacco: Never  Substance and Sexual Activity   Alcohol use: No   Drug use: No   Sexual activity: Not on file  Other Topics Concern   Not on file  Social  History Narrative   Not on file   Social Drivers of Health   Financial Resource Strain: Low Risk  (05/25/2023)   [facility]   Overall Financial Resource Strain (CARDIA)    Difficulty of Paying Living Expenses: Not hard at all  Food Insecurity: No Food Insecurity (05/25/2023)   Received from [facility]   Hunger Vital Sign    Worried About Running Out of Food in the Last Year: Never true    Ran Out of Food in the Last Year: Never true  Transportation Needs: No Transportation Needs (05/25/2023)   Received from [facility]    Lack of  Transportation (Medical): No    Lack of Transportation (Non-Medical): No  Physical Activity: Not on file  Stress: Not on file  Social Connections: Unknown (11/22/2021)   Received from [facility]   Social Network    Social Network: Not on file  Intimate Partner Violence: Not At Risk (08/30/2023)   Received from [facility]   HITS    Over the last 12 months how often did your partner physically hurt you?: Never    Over the last 12 months how often did your partner insult you or talk down to you?: Never    Over the last 12 months how often did your partner threaten you with physical harm?: Never    Over the last 12 months how often did your partner scream or curse at you?: Never    No family history on file.   Review of Systems  Constitutional: Negative.  Negative for chills and fever.  HENT: Negative.  Negative for congestion and sore throat.   Respiratory: Negative.  Negative for cough and shortness of breath.   Cardiovascular: Negative.  Negative for chest pain and palpitations.  Gastrointestinal:  Negative for abdominal pain, diarrhea, nausea and vomiting.  Genitourinary: Negative.  Negative for dysuria and hematuria.  Skin: Negative.  Negative for rash.  Neurological: Negative.  Negative for dizziness and headaches.    Vitals:   11/13/23 1544  BP: 102/70  Pulse: 73  Temp: 98.8 F (37.1 C)  SpO2: 100%    Physical Exam Vitals reviewed.  Constitutional:      Appearance: Normal appearance.  HENT:     Head: Normocephalic.  Eyes:     Extraocular Movements: Extraocular movements intact.     Pupils: Pupils are equal, round, and reactive to light.  Cardiovascular:     Rate and Rhythm: Normal rate and regular rhythm.  Pulmonary:     Effort: Pulmonary effort is normal.     Breath sounds: Normal breath sounds.  Musculoskeletal:     Comments: Lower extremities: No significant swelling or tenderness Good distal peripheral pulses.  Good distal sensation  Skin:     General: Skin is warm and dry.  Neurological:     Mental Status: She is alert and oriented to person, place, and time.  Psychiatric:        Mood and Affect: Mood normal.        Behavior: Behavior normal.      ASSESSMENT & PLAN: A total of 42 minutes was spent with the patient and counseling/coordination of care regarding preparing for this visit, review of most recent office visit notes, review of multiple chronic medical conditions and their management, review of all medications, review of most recent bloodwork results, review of health maintenance items, education on nutrition, prognosis, documentation, and need for follow up.   Problem List  Items Addressed This Visit       Cardiovascular and Mediastinum   Essential hypertension   BP Readings from Last 3 Encounters:  11/13/23 102/70  08/14/23 120/74  02/04/22 128/86  Well-controlled hypertension Continue lisinopril 10 mg daily       Raynaud's disease without gangrene   Picture shown very typical of Raynaud's phenomena Recommend vascular evaluation.  Referral placed today      Relevant Orders   Ambulatory referral to Vascular Surgery     Respiratory   Obstructive sleep apnea   Presently on CPAP treatment but needs follow-up Referral for sleep apnea clinic placed today      Relevant Orders   Ambulatory referral to Sleep Studies     Other   History of DVT (deep vein thrombosis)   Diagnosed last November Provoked by estrogen therapy Presently on Eliquis  5 mg twice a day Recommend follow-up with vascular surgeon Referral placed today      Relevant Orders   VAS US  LOWER EXTREMITY VENOUS (DVT)   Ambulatory referral to Vascular Surgery   Bilateral leg pain - Primary   History of DVT last November Continues on Eliquis  5 mg twice a day Needs follow-up ultrasound of lower extremities      Relevant Orders   VAS US  LOWER EXTREMITY VENOUS (DVT)   Ambulatory referral to Vascular Surgery   Patient Instructions   Fenmeno de Raynaud Raynaud's Phenomenon  El fenmeno de Raynaud es una enfermedad que afecta los vasos sanguneos (arterias) que transportan la sangre a los dedos de las manos y de los pies. Las arterias que [address] a las [place], los labios, los pezones o la punta de la nariz tambin pueden verse afectadas. El fenmeno de Raynaud causa el estrechamiento temporal de las arterias (espasmo). Como consecuencia, la irrigacin de sangre a las zonas afectadas disminuye transitoriamente. Esto suele ocurrir en respuesta a las bajas temperaturas o el estrs. Durante una crisis, la piel de las zonas afectadas se pone blanca, luego azul y finalmente roja. Adems, la persona puede sentir hormigueo o adormecimiento en esas zonas. Generalmente, las crisis duran [place], y [employer] la irrigacin de sangre a la zona se Air traffic controller. En la [employer], el fenmeno de Raynaud no causa problemas graves de [place]. Cules son las causas? En muchos casos, se desconoce la causa de esta afeccin. La afeccin puede manifestarse por s sola (fenmeno de Raynaud primario) o puede estar asociada a otras enfermedades u otros factores (fenmeno de Raynaud secundario). Entre las causas posibles pueden incluirse: Enfermedades o afecciones que daan las arterias. Lesiones o acciones repetitivas que [employer] o los pies. La exposicin a ciertas sustancias qumicas. Tomar medicamentos que producen el estrechamiento de las arterias. Otros trastornos mdicos, tales como lupus, esclerodermia, artritis reumatoide, problemas de tiroides, trastornos de [employer], sndrome de [place] o [employer]. Qu incrementa el riesgo? Los siguientes factores pueden hacer que sea ms propenso a desarrollar esta afeccin: Tener entre 20 y 18 aos. Ser mujer. Tener antecedentes familiares del fenmeno de Raynaud. Vivir en un lugar con bajas temperaturas. Fumar. Cules son los signos o sntomas? Los sntomas de fenmeno  de Raynaud normalmente se manifiestan cuando una persona se expone a bajas temperaturas o sufre estrs emocional. Pueden durar unos minutos o varias horas. Por lo general, esta enfermedad afecta los dedos de las [address], pero tambin puede afectar los dedos de los pies, los pezones, los labios, las orejas o la punta de la nariz. [employer], se  pueden incluir los siguientes: Cambios en el color de la piel. La piel de las zonas afectadas se tornar plida o blanca. Luego, puede pasar de blanca a azulada y de azulada a roja, a medida que se restablece la irrigacin normal de sangre a la zona. Adormecimiento, hormigueo o dolor en las zonas afectadas. En los casos graves, los sntomas pueden incluir lo siguiente: Probation officer piel. Deterioro y muerte de los tejidos (gangrena). Cmo se diagnostica? Esta afeccin se puede diagnosticar en funcin de lo siguiente: Los sntomas y los antecedentes mdicos. Un examen fsico. Durante el examen, pueden indicarle que ponga las manos en agua fra para determinar si hay una reaccin a la baja temperatura. Estudios, como, por ejemplo: Anlisis de sangre para detectar si hay otras enfermedades o afecciones. Un estudio para Nurse, children's de la sangre a travs de las arterias y las venas (ecografa vascular). Un estudio mediante el cual la piel de la base de las uas de las manos se examina con un microscopio (capilaroscopia del lecho ungueal). Cmo se trata? Durante un episodio, puede tomar medidas para ayudar a que los sntomas desaparezcan ms rpido. Las opciones [employer] brazos como la aspas de un molino de viento, calentarse los dedos debajo de agua caliente o poner los dedos en un pliegue clido del cuerpo, como la [place]. El tratamiento a largo plazo para esta afeccin suele implicar hacer cambios en el estilo de vida y tomar medidas para controlar la exposicin a las Barrister's clerk. En los casos ms graves, pueden usarse  medicamentos (antagonistas del calcio) para Dealer sangunea. Siga estas instrucciones en su casa: Evite las bajas temperaturas Para evitar la exposicin al fro, siga estos pasos: Si es posible, qudese adentro cuando haga fro. Cuando salga durante la poca de bajas temperaturas, vstase con varias capas de ropa y use mitones, un gorro, una bufanda y zapatos abrigados. Use mitones o guantes cuando manipule hielo o comida congelada. Use portavasos o portalatas para los vasos o las latas que contengan bebidas fras. Deje correr el agua caliente durante un rato antes de tomar una ducha o un bao. Caliente el automvil antes de [employer]. Estilo de vida Si es posible, evite las situaciones emotivas y Geographical information systems officer. Trate de encontrar herramientas para controlar el estrs, por ejemplo: Actividad fsica. Yoga. Meditacin. Biorretroalimentacin. No consuma ningn producto que contenga nicotina o tabaco. Estos productos incluyen cigarrillos, tabaco para Theatre manager y aparatos de vapeo, como los cigarrillos electrnicos. Si necesita ayuda para dejar de consumir estos productos, consulte al [employer]. Evite ser fumador pasivo. Limite el consumo de cafena. En cambio, tome caf, t y gaseosas descafeinadas. Evite el chocolate. No use herramientas ni maquinaria que vibren. Indicaciones generales Protjase las manos y los pies para no sufrir lesiones, cortes o moretones. Evite el uso de anillos o pulseras muy ajustados. Use medias flojas y zapatos cmodos y amplios. Use los medicamentos de venta libre y los recetados solamente como se lo haya indicado el mdico. Dnde buscar apoyo Raynaud's Association (Asociacin de Raynaud): www.raynauds.org Dnde buscar ms informacin [employer] of Arthritis and Musculoskeletal and Skin Diseases (Instituto [employer] de Artritis y [place] Musculoesquelticas y Arboriculturist): www.niams.http://www.myers.net/ Comunquese con un mdico si: Las  [employer] a pesar de los cambios en el estilo de vida. Le aparecen llagas en los dedos de las manos o de los pies que no se cicatrizan. Tiene estras en la piel de los dedos de las manos o de los pies. Tiene fiebre.  Tiene dolor o hinchazn en las articulaciones. Tiene una erupcin cutnea. Los sntomas aparecen en un solo lado del cuerpo. Solicite ayuda de inmediato si: Los dedos de las manos o de los pies se tornan negros. Tiene dolor intenso en las zonas afectadas. Estos sntomas pueden representar un problema grave que constituye Radio broadcast assistant. No espere a ver si los sntomas desaparecen. Solicite atencin mdica de inmediato. Comunquese con el servicio de emergencias de su localidad (911 en los [place]). No conduzca por sus propios medios [employer]. Resumen El fenmeno de Raynaud es una enfermedad que afecta las arterias que transportan la sangre a los dedos de las manos y de los pies, las [place], los labios, los pezones o la punta de la nariz. En muchos casos, se desconoce la causa de esta afeccin. Los sntomas de esta afeccin incluyen cambios en el color de la piel junto con entumecimiento y hormigueo de la zona afectada. El tratamiento de esta enfermedad incluye cambios en el estilo de vida y reduccin de la exposicin a las bajas temperaturas. Pueden usarse medicamentos para los casos graves de Astronomer. Comunquese con su mdico si la afeccin empeora aunque reciba tratamiento. Esta informacin no tiene Theme park manager el consejo del mdico. Asegrese de hacerle al mdico cualquier pregunta que tenga. Document Revised: 09/14/2020 Document Reviewed: 09/14/2020 Elsevier Patient Education  2024 Elsevier Inc.    Maryagnes Small, MD [facility]

## 2023-11-13 NOTE — Assessment & Plan Note (Signed)
 Diagnosed last November Provoked by estrogen therapy Presently on Eliquis 5 mg twice a day Recommend follow-up with vascular surgeon Referral placed today

## 2023-11-14 ENCOUNTER — Other Ambulatory Visit: Payer: Self-pay | Admitting: Radiology

## 2023-11-14 DIAGNOSIS — Z86718 Personal history of other venous thrombosis and embolism: Secondary | ICD-10-CM

## 2023-11-14 DIAGNOSIS — I10 Essential (primary) hypertension: Secondary | ICD-10-CM

## 2023-11-14 MED ORDER — LISINOPRIL 10 MG PO TABS: 10.0000 mg | ORAL_TABLET | Freq: Every day | ORAL | 3 refills | Status: AC

## 2023-11-14 MED ORDER — ELIQUIS 5 MG PO TABS: 5.0000 mg | ORAL_TABLET | Freq: Two times a day (BID) | ORAL | 0 refills | Status: DC

## 2023-11-14 NOTE — Telephone Encounter (Signed)
 Rx refilled to pharmacy requested

## 2023-12-30 ENCOUNTER — Other Ambulatory Visit: Payer: Self-pay | Admitting: Emergency Medicine

## 2023-12-30 DIAGNOSIS — Z86718 Personal history of other venous thrombosis and embolism: Secondary | ICD-10-CM

## 2024-05-01 ENCOUNTER — Other Ambulatory Visit: Payer: Self-pay

## 2024-05-01 DIAGNOSIS — M79604 Pain in right leg: Secondary | ICD-10-CM
